# Patient Record
Sex: Male | Born: 1945 | Race: White | Hispanic: No | Marital: Married | Smoking: Never smoker
Health system: Southern US, Community
[De-identification: ages and names within clinical notes are randomized; demographics above are authoritative.]

## PROBLEM LIST (undated history)

## (undated) DIAGNOSIS — I639 Cerebral infarction, unspecified: Secondary | ICD-10-CM

## (undated) DIAGNOSIS — I10 Essential (primary) hypertension: Secondary | ICD-10-CM

## (undated) DIAGNOSIS — I4891 Unspecified atrial fibrillation: Secondary | ICD-10-CM

## (undated) DIAGNOSIS — M62838 Other muscle spasm: Secondary | ICD-10-CM

## (undated) DIAGNOSIS — I499 Cardiac arrhythmia, unspecified: Secondary | ICD-10-CM

## (undated) HISTORY — PX: SKIN CANCER EXCISION: SHX779

## (undated) HISTORY — PX: CARDIOVERSION: SHX1299

## (undated) HISTORY — PX: DENTAL SURGERY: SHX609

## (undated) HISTORY — PX: OTHER SURGICAL HISTORY: SHX169

---

## 2012-03-08 ENCOUNTER — Emergency Department (HOSPITAL_COMMUNITY): Payer: BC Managed Care – PPO

## 2012-03-08 ENCOUNTER — Encounter (HOSPITAL_COMMUNITY): Payer: Self-pay | Admitting: *Deleted

## 2012-03-08 ENCOUNTER — Emergency Department (HOSPITAL_COMMUNITY)
Admission: EM | Admit: 2012-03-08 | Discharge: 2012-03-08 | Disposition: A | Discharge status: Home / Self Care | Payer: BC Managed Care – PPO | Attending: Emergency Medicine | Admitting: Emergency Medicine

## 2012-03-08 DIAGNOSIS — R109 Unspecified abdominal pain: Secondary | ICD-10-CM | POA: Insufficient documentation

## 2012-03-08 DIAGNOSIS — K5730 Diverticulosis of large intestine without perforation or abscess without bleeding: Secondary | ICD-10-CM | POA: Insufficient documentation

## 2012-03-08 DIAGNOSIS — D72829 Elevated white blood cell count, unspecified: Secondary | ICD-10-CM | POA: Insufficient documentation

## 2012-03-08 DIAGNOSIS — M549 Dorsalgia, unspecified: Secondary | ICD-10-CM | POA: Insufficient documentation

## 2012-03-08 DIAGNOSIS — K5900 Constipation, unspecified: Secondary | ICD-10-CM | POA: Insufficient documentation

## 2012-03-08 HISTORY — DX: Other muscle spasm: M62.838

## 2012-03-08 HISTORY — DX: Essential (primary) hypertension: I10

## 2012-03-08 LAB — URINALYSIS, ROUTINE W REFLEX MICROSCOPIC
Bilirubin Urine: NEGATIVE
Hgb urine dipstick: NEGATIVE
Protein, ur: NEGATIVE mg/dL
Urobilinogen, UA: 0.2 mg/dL (ref 0.0–1.0)

## 2012-03-08 LAB — COMPREHENSIVE METABOLIC PANEL
ALT: 24 U/L (ref 0–53)
Alkaline Phosphatase: 67 U/L (ref 39–117)
CO2: 28 mEq/L (ref 19–32)
Chloride: 99 mEq/L (ref 96–112)
GFR calc Af Amer: 85 mL/min — ABNORMAL LOW (ref >90)
GFR calc non Af Amer: 74 mL/min — ABNORMAL LOW (ref >90)
Glucose, Bld: 161 mg/dL — ABNORMAL HIGH (ref 70–99)
Potassium: 4.2 mEq/L (ref 3.5–5.1)
Sodium: 137 mEq/L (ref 135–145)
Total Bilirubin: 1.3 mg/dL — ABNORMAL HIGH (ref 0.3–1.2)

## 2012-03-08 LAB — CBC
MCV: 83.6 fL (ref 78.0–100.0)
Platelets: 208 10*3/uL (ref 150–400)
RBC: 5.31 MIL/uL (ref 4.22–5.81)
WBC: 11.7 10*3/uL — ABNORMAL HIGH (ref 4.0–10.5)

## 2012-03-08 LAB — DIFFERENTIAL
Eosinophils Relative: 1 % (ref 0–5)
Lymphocytes Relative: 8 % — ABNORMAL LOW (ref 12–46)
Lymphs Abs: 0.9 10*3/uL (ref 0.7–4.0)
Neutrophils Relative %: 87 % — ABNORMAL HIGH (ref 43–77)

## 2012-03-08 MED ORDER — MORPHINE SULFATE 4 MG/ML IJ SOLN: 4.0000 mg | Freq: Once | INTRAMUSCULAR | Status: DC

## 2012-03-08 MED ORDER — METRONIDAZOLE 500 MG PO TABS: 500.0000 mg | ORAL_TABLET | Freq: Three times a day (TID) | ORAL | Status: AC

## 2012-03-08 MED ORDER — METRONIDAZOLE 500 MG PO TABS
500.0000 mg | ORAL_TABLET | Freq: Once | ORAL | Status: AC
Start: 2012-03-08 — End: 2012-03-08
  Administered 2012-03-08: 500 mg via ORAL
  Filled 2012-03-08: qty 1

## 2012-03-08 MED ORDER — CIPROFLOXACIN HCL 500 MG PO TABS
750.0000 mg | ORAL_TABLET | Freq: Once | ORAL | Status: AC
  Administered 2012-03-08: 750 mg via ORAL
  Filled 2012-03-08: qty 2

## 2012-03-08 MED ORDER — IOHEXOL 300 MG/ML  SOLN
100.0000 mL | Freq: Once | INTRAMUSCULAR | Status: AC | PRN
  Administered 2012-03-08: 100 mL via INTRAVENOUS

## 2012-03-08 MED ORDER — CIPROFLOXACIN HCL 500 MG PO TABS: 500.0000 mg | ORAL_TABLET | Freq: Two times a day (BID) | ORAL | Status: AC

## 2012-03-08 MED ORDER — ONDANSETRON HCL 4 MG/2ML IJ SOLN
4.0000 mg | Freq: Once | INTRAMUSCULAR | Status: AC
  Administered 2012-03-08: 4 mg via INTRAVENOUS
  Filled 2012-03-08: qty 2

## 2012-03-08 NOTE — ED Notes (Signed)
Care of pt assumed. Pt sts pain is better, describes as pressure in lower abd. Updated on results, plan of care. Awaiting EDP dispo.

## 2012-03-08 NOTE — ED Notes (Signed)
Received pt. From waiting room pt. Alert and oriented, NAD noted, c/o abd. Blotting , abd. Soft and distended

## 2012-03-08 NOTE — ED Notes (Signed)
Pt noted that his abdomen was beginning to swell yesterday and was becoming more distended.  Pt states that he feels bloated and as if he needs to flatulate but is unable to do so.  Pt states that he has had 2 stools today, both were loose and small in amount.  Pt states that his abdomen hurts in all four quadrants equally and that his back hurts as well.  Bowel sounds are present in all 4 quadrants, but sluggish.

## 2012-03-08 NOTE — Discharge Instructions (Signed)
Abdominal Pain Abdominal pain can be caused by many things. Your caregiver decides the seriousness of your pain by an examination and possibly blood tests and X-rays. Many cases can be observed and treated at home. Most abdominal pain is not caused by a disease and will probably improve without treatment. However, in many cases, more time must pass before a clear cause of the pain can be found. Before that point, it may not be known if you need more testing, or if hospitalization or surgery is needed. HOME CARE INSTRUCTIONS   Do not take laxatives unless directed by your caregiver.   Take pain medicine only as directed by your caregiver.   Only take over-the-counter or prescription medicines for pain, discomfort, or fever as directed by your caregiver.   Try a clear liquid diet (broth, tea, or water) for as long as directed by your caregiver. Slowly move to a bland diet as tolerated.  SEEK IMMEDIATE MEDICAL CARE IF:   The pain does not go away.   You have a fever.   You keep throwing up (vomiting).   The pain is felt only in portions of the abdomen. Pain in the right side could possibly be appendicitis. In an adult, pain in the left lower portion of the abdomen could be colitis or diverticulitis.   You pass bloody or black tarry stools.  MAKE SURE YOU:   Understand these instructions.   Will watch your condition.   Will get help right away if you are not doing well or get worse.  Document Released: 09/17/2005 Document Revised: 11/27/2011 Document Reviewed: 07/26/2008 [employer] Patient Information 2012 [employer].Abdominal Pain Abdominal pain can be caused by many things. Your caregiver decides the seriousness of your pain by an examination and possibly blood tests and X-rays. Many cases can be observed and treated at home. Most abdominal pain is not caused by a disease and will probably improve without treatment. However, in many cases, more time must pass before a clear cause of  the pain can be found. Before that point, it may not be known if you need more testing, or if hospitalization or surgery is needed. HOME CARE INSTRUCTIONS   Do not take laxatives unless directed by your caregiver.   Take pain medicine only as directed by your caregiver.   Only take over-the-counter or prescription medicines for pain, discomfort, or fever as directed by your caregiver.   Try a clear liquid diet (broth, tea, or water) for as long as directed by your caregiver. Slowly move to a bland diet as tolerated.  SEEK IMMEDIATE MEDICAL CARE IF:   The pain does not go away.   You have a fever.   You keep throwing up (vomiting).   The pain is felt only in portions of the abdomen. Pain in the right side could possibly be appendicitis. In an adult, pain in the left lower portion of the abdomen could be colitis or diverticulitis.   You pass bloody or black tarry stools.  MAKE SURE YOU:   Understand these instructions.   Will watch your condition.   Will get help right away if you are not doing well or get worse.  Document Released: 09/17/2005 Document Revised: 11/27/2011 Document Reviewed: 07/26/2008 [employer] Patient Information 2012 [employer]. 

## 2012-03-12 NOTE — ED Provider Notes (Signed)
History     CSN: [id number]  Arrival date & time 03/08/12  9528   First MD Initiated Contact with Patient 03/08/12 0335      Chief Complaint  Patient presents with  . Abdominal Pain    (Consider location/radiation/quality/duration/timing/severity/associated sxs/prior treatment) HPI Patient is a 66 yo M who presents complaining of 9/10 diffuse abdominal tenderness.  He feels that this has increased gradually since yesterday and has been accompanied with abdominal distension, nausea, and constipation.  He denies fever, urinary symptoms, chest pain, and shortness of breath.  Patient reports passing flatus and having 2 small stools after dulcolax suppositories but he is concerned that he could be obstructed.  He has no prior history of this.  PAtient denies blood in his stools or dark tarry stools.  He has no known sick contacts.  There are no other associated or modifying factors.  Past Medical History  Diagnosis Date  . Diverticulitis   . Hypertension   . Night muscle spasms     lower back and flank area    History reviewed. No pertinent past surgical history.  History reviewed. No pertinent family history.  History  Substance Use Topics  . Smoking status: Not on file  . Smokeless tobacco: Not on file  . Alcohol Use: No      Review of Systems  Constitutional: Negative.   HENT: Negative.   Eyes: Negative.   Respiratory: Negative.   Cardiovascular: Negative.   Gastrointestinal: Positive for nausea, abdominal pain and constipation.  Genitourinary: Negative.   Musculoskeletal: Negative.   Skin: Negative.   Neurological: Negative.   Hematological: Negative.   Psychiatric/Behavioral: Negative.   All other systems reviewed and are negative.    Allergies  Review of patient's allergies indicates no known allergies.  Home Medications   Current Outpatient Rx  Name Route Sig Dispense Refill  . AMLODIPINE BESYLATE 10 MG PO TABS Oral Take 10 mg by mouth daily.    .  ASPIRIN EC 81 MG PO TBEC Oral Take 81 mg by mouth daily.    Marland Kitchen BISACODYL 5 MG PO TBEC Oral Take 5 mg by mouth daily as needed. For constipation    . GENTEAL OP Ophthalmic Apply 1 drop to eye 4 (four) times daily as needed. For dry irritated eyes    . LISINOPRIL 40 MG PO TABS Oral Take 40 mg by mouth daily.    Marland Kitchen SIMETHICONE 80 MG PO CHEW Oral Chew 80 mg by mouth every 6 (six) hours as needed. For gas    . CIPROFLOXACIN HCL 500 MG PO TABS Oral Take 1 tablet (500 mg total) by mouth every 12 (twelve) hours. 20 tablet 0  . METRONIDAZOLE 500 MG PO TABS Oral Take 1 tablet (500 mg total) by mouth 3 (three) times daily. 30 tablet 0    BP 119/77  Pulse 75  Temp(Src) 98.2 F (36.8 C) (Oral)  Resp 16  Ht 6\' 3"  (1.905 m)  Wt 225 lb (102.059 kg)  BMI 28.12 kg/m2  SpO2 95%  Physical Exam  Nursing note and vitals reviewed. GEN: Well-developed, well-nourished male in no distress HEENT: Atraumatic, normocephalic. Oropharynx clear without erythema EYES: PERRLA BL, no scleral icterus. NECK: Trachea midline, no meningismus CV: regular rate and rhythm. No murmurs, rubs, or gallops PULM: No respiratory distress.  No crackles, wheezes, or rales. GI: soft, diffuse mild TTP. No guarding, rebound,. + bowel sounds Rectal with small amount of guaiac negative soft brown stool with no impaction.  No TTP on  prostate GU: deferred Neuro: cranial nerves 2-12 intact, no abnormalities of strength or sensation, A and O x 3 MSK: Patient moves all 4 extremities symmetrically, no deformity, edema, or injury noted Skin: No rashes petechiae, purpura, or jaundice Psych: no abnormality of mood   ED Course  Procedures (including critical care time)  Labs Reviewed  CBC - Abnormal; Notable for the following:    WBC 11.7 (*)    All other components within normal limits  DIFFERENTIAL - Abnormal; Notable for the following:    Neutrophils Relative 87 (*)    Neutro Abs 10.1 (*)    Lymphocytes Relative 8 (*)    All other  components within normal limits  COMPREHENSIVE METABOLIC PANEL - Abnormal; Notable for the following:    Glucose, Bld 161 (*)    Total Bilirubin 1.3 (*)    GFR calc non Af Amer 74 (*)    GFR calc Af Amer 85 (*)    All other components within normal limits  LIPASE, BLOOD  URINALYSIS, ROUTINE W REFLEX MICROSCOPIC  LACTIC ACID, PLASMA  OCCULT BLOOD, POC DEVICE  LAB REPORT - SCANNED   No results found.   1. Abdominal pain   2. Constipation       MDM  Patient was treated symptomatically and had complete work-up for abdominal pain including CT.  This was all relatively unremarkable.  Patient did have symptoms that appeared to be more pronounced in the LLQ as visit progressed.  Given patient presentation and this being so atypical for him empiric therapy for diverticulitis was felt to be a reasonable option.  Patient was given cipro and flagyl with prescription for each.  Patient was discharged in good condition with precautions for reasons to return.        Cyndra Numbers, MD 03/12/12 [phone]

## 2015-12-31 ENCOUNTER — Encounter (HOSPITAL_COMMUNITY): Payer: Self-pay | Admitting: Oncology

## 2015-12-31 ENCOUNTER — Emergency Department (HOSPITAL_COMMUNITY): Payer: Medicare HMO

## 2015-12-31 ENCOUNTER — Emergency Department (HOSPITAL_COMMUNITY)
Admission: EM | Admit: 2015-12-31 | Discharge: 2015-12-31 | Disposition: A | Discharge status: Home / Self Care | Payer: Medicare HMO | Attending: Emergency Medicine | Admitting: Emergency Medicine

## 2015-12-31 DIAGNOSIS — Z79899 Other long term (current) drug therapy: Secondary | ICD-10-CM | POA: Diagnosis not present

## 2015-12-31 DIAGNOSIS — Y9289 Other specified places as the place of occurrence of the external cause: Secondary | ICD-10-CM | POA: Diagnosis not present

## 2015-12-31 DIAGNOSIS — I10 Essential (primary) hypertension: Secondary | ICD-10-CM | POA: Diagnosis not present

## 2015-12-31 DIAGNOSIS — Y9389 Activity, other specified: Secondary | ICD-10-CM | POA: Diagnosis not present

## 2015-12-31 DIAGNOSIS — Y998 Other external cause status: Secondary | ICD-10-CM | POA: Insufficient documentation

## 2015-12-31 DIAGNOSIS — W000XXA Fall on same level due to ice and snow, initial encounter: Secondary | ICD-10-CM | POA: Insufficient documentation

## 2015-12-31 DIAGNOSIS — Z8719 Personal history of other diseases of the digestive system: Secondary | ICD-10-CM | POA: Diagnosis not present

## 2015-12-31 DIAGNOSIS — M62830 Muscle spasm of back: Secondary | ICD-10-CM

## 2015-12-31 DIAGNOSIS — S0990XA Unspecified injury of head, initial encounter: Secondary | ICD-10-CM | POA: Diagnosis present

## 2015-12-31 DIAGNOSIS — S0003XA Contusion of scalp, initial encounter: Secondary | ICD-10-CM | POA: Insufficient documentation

## 2015-12-31 DIAGNOSIS — S199XXA Unspecified injury of neck, initial encounter: Secondary | ICD-10-CM | POA: Insufficient documentation

## 2015-12-31 DIAGNOSIS — Z7982 Long term (current) use of aspirin: Secondary | ICD-10-CM | POA: Diagnosis not present

## 2015-12-31 DIAGNOSIS — S3992XA Unspecified injury of lower back, initial encounter: Secondary | ICD-10-CM | POA: Insufficient documentation

## 2015-12-31 DIAGNOSIS — W010XXA Fall on same level from slipping, tripping and stumbling without subsequent striking against object, initial encounter: Secondary | ICD-10-CM

## 2015-12-31 MED ORDER — ORPHENADRINE CITRATE ER 100 MG PO TB12: 100.0000 mg | ORAL_TABLET | Freq: Two times a day (BID) | ORAL | Status: DC

## 2015-12-31 MED ORDER — CYCLOBENZAPRINE HCL 10 MG PO TABS
10.0000 mg | ORAL_TABLET | Freq: Once | ORAL | Status: AC
  Administered 2015-12-31: 10 mg via ORAL
  Filled 2015-12-31: qty 1

## 2015-12-31 MED ORDER — OXYCODONE-ACETAMINOPHEN 5-325 MG PO TABS
1.0000 | ORAL_TABLET | Freq: Once | ORAL | Status: AC
  Administered 2015-12-31: 1 via ORAL
  Filled 2015-12-31: qty 1

## 2015-12-31 MED ORDER — OXYCODONE-ACETAMINOPHEN 5-325 MG PO TABS: 1.0000 | ORAL_TABLET | ORAL | Status: DC | PRN

## 2015-12-31 NOTE — ED Notes (Addendum)
Per pt he slipped and fell on the ice today injuring his back and hitting his head.  Denies LOC.  Per pt he cannot find a comfortable position.  Pt c/o back spasms.  No anticoagulants.  Pt also c/o neck pain that radiates into b/l shoulders.

## 2015-12-31 NOTE — ED Provider Notes (Signed)
CSN: [id number]     Arrival date & time 12/31/15  0008 History  By signing my name below, I, Bethel BornBritney McCollum, attest that this documentation has been prepared under the direction and in the presence of Dione Boozeavid Harvey Matlack, MD. Electronically Signed: Bethel BornBritney McCollum, ED Scribe. 12/31/2015. 2:39 AM    Chief Complaint  Patient presents with  . Back Pain    The history is provided by the patient. No language interpreter was used.   Tyler Ruiz is a 70 y.o. male who presents to the Emergency Department complaining of a fall yesterday near 8 PM. The pt states that he slipped on ice and fell backwards. He hit his head but denies LOC.  Associated symptoms include 9/10 in severity, diffuse sore and spasming back pain that is worse with laying flat and neck pain that radiates to the bilateral shoulders. Advil provided insufficient pain relief at home.  Pt denies numbness, tingling, weakness. Pt denies blood thinners apart from aspirin.  PCP [place]. [place] in [place].    Past Medical History  Diagnosis Date  . Diverticulitis   . Hypertension   . Night muscle spasms     lower back and flank area   History reviewed. No pertinent past surgical history. History reviewed. No pertinent family history. Social History  Substance Use Topics  . Smoking status: Never Smoker   . Smokeless tobacco: Never Used  . Alcohol Use: No    Review of Systems  Musculoskeletal: Positive for back pain and neck pain.  Neurological: Negative for syncope, weakness and numbness.  All other systems reviewed and are negative.  Allergies  Review of patient's allergies indicates no known allergies.  Home Medications   Prior to Admission medications   Medication Sig Start Date End Date Taking? Authorizing Provider  amLODipine (NORVASC) 10 MG tablet Take 5 mg by mouth daily.    Yes Historical Provider, MD  aspirin EC 81 MG tablet Take 81 mg by mouth daily.   Yes Historical Provider, MD  cholecalciferol (VITAMIN D) 1000  units tablet Take 1,000 Units by mouth daily.   Yes Historical Provider, MD  docusate sodium (COLACE) 100 MG capsule Take 100-200 mg by mouth daily as needed for mild constipation.   Yes Historical Provider, MD  flecainide (TAMBOCOR) 100 MG tablet Take 100 mg by mouth 2 (two) times daily. 12/04/15  Yes Historical Provider, MD  lisinopril (PRINIVIL,ZESTRIL) 40 MG tablet Take 40 mg by mouth daily.   Yes Historical Provider, MD  Multiple Vitamin (MULTIVITAMIN WITH MINERALS) TABS tablet Take 1 tablet by mouth daily.   Yes Historical Provider, MD  Omega-3 Fatty Acids (FISH OIL) 1000 MG CAPS Take 1,000 mg by mouth daily.   Yes Historical Provider, MD   BP 154/87 mmHg  Temp(Src) 98.1 F (36.7 C) (Oral)  Resp 20  SpO2 97% Physical Exam  Constitutional: He is oriented to person, place, and time. He appears well-developed and well-nourished.  HENT:  Head: Normocephalic and atraumatic.  Eyes: EOM are normal. Pupils are equal, round, and reactive to light.  Neck: No JVD present.  Mild tenderness over the cervical spine  Cardiovascular: Normal rate, regular rhythm, normal heart sounds and intact distal pulses.   No murmur heard. Pulmonary/Chest: Effort normal and breath sounds normal. He has no wheezes. He has no rales. He exhibits no tenderness.  Abdominal: Soft. Bowel sounds are normal. He exhibits no distension and no mass. There is no tenderness.  Musculoskeletal: Normal range of motion. He exhibits no edema.  Lymphadenopathy:    He has no cervical adenopathy.  Neurological: He is alert and oriented to person, place, and time. No cranial nerve deficit. He exhibits normal muscle tone. Coordination normal.  Skin: Skin is warm and dry. No rash noted.  Psychiatric: He has a normal mood and affect. His behavior is normal. Judgment and thought content normal.  Nursing note and vitals reviewed.   ED Course  Procedures (including critical care time) DIAGNOSTIC STUDIES: Oxygen Saturation is 97% on  RA,  normal by my interpretation.    COORDINATION OF CARE: 12:35 AM Discussed treatment plan which includes Percocet, Flexeril, CT head without contrast, CT cervical spine without contrast, XR of the thoracic spine, and XR of the lumbar spine with pt at bedside and pt agreed to plan.  2:37 AM I re-evaluated the patient and provided an update on the results of his imaging.    Imaging Review Dg Thoracic Spine W/swimmers  12/31/2015  CLINICAL DATA:  Thoracic back pain after slip and fall on ice this evening. EXAM: THORACIC SPINE - 3 VIEWS COMPARISON:  None. FINDINGS: The alignment is maintained. Vertebral body heights are maintained. Disc space narrowing and endplate spurring in the lower thoracic spine. There is no paravertebral soft tissue abnormality. IMPRESSION: No evidence of fracture or subluxation of the thoracic spine. Electronically Signed   By: Rubye Oaks M.D.   On: 12/31/2015 01:57   Dg Lumbar Spine Complete  12/31/2015  CLINICAL DATA:  Lumbosacral back pain after slip and fall on ice this evening. EXAM: LUMBAR SPINE - COMPLETE 4+ VIEW COMPARISON:  None. FINDINGS: There is transitional lumbosacral anatomy with lumbarization of S1. The alignment is maintained. Vertebral body heights are normal. There is no listhesis. The posterior elements are intact. Mild disc space narrowing and endplate spurring at L2-L3. No fracture. Facet arthropathy in the lower lumbar spine. Sacroiliac joints are symmetric and normal. IMPRESSION: Degenerative change in the lumbar spine without acute fracture or subluxation. Electronically Signed   By: Rubye Oaks M.D.   On: 12/31/2015 01:55   Ct Head Wo Contrast  12/31/2015  CLINICAL DATA:  Slip and fall on ice last evening, no loss of consciousness. Neck pain radiating to both shoulders. EXAM: CT HEAD WITHOUT CONTRAST CT CERVICAL SPINE WITHOUT CONTRAST TECHNIQUE: Multidetector CT imaging of the head and cervical spine was performed following the standard  protocol without intravenous contrast. Multiplanar CT image reconstructions of the cervical spine were also generated. COMPARISON:  None. FINDINGS: CT HEAD FINDINGS No intracranial hemorrhage, mass effect, or midline shift. No hydrocephalus. The basilar cisterns are patent. No evidence of territorial infarct. No intracranial fluid collection. Dense atherosclerosis of skullbase vasculature. Ectasia of the basilar artery. Calvarium is intact. Included paranasal sinuses and mastoid air cells are well aerated. CT CERVICAL SPINE FINDINGS Cervical spine alignment is maintained. Vertebral body heights are preserved. There is no fracture. The dens is intact. There are no jumped or perched facets. Mild disc space narrowing and endplate spurring throughout. Scattered facet arthropathy. No prevertebral soft tissue edema. Diffuse heterogeneous enlargement of the thyroid gland consistent with goiter. IMPRESSION: 1. No acute intracranial abnormality. 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 2. Degenerative change in the cervical spine without acute fracture or subluxation. Electronically Signed   By: Rubye Oaks M.D.   On: 12/31/2015 02:02   Ct Cervical Spine Wo Contrast  12/31/2015  CLINICAL DATA:  Slip and fall on ice last evening, no loss of consciousness. Neck pain radiating to both shoulders. EXAM:  CT HEAD WITHOUT CONTRAST CT CERVICAL SPINE WITHOUT CONTRAST TECHNIQUE: Multidetector CT imaging of the head and cervical spine was performed following the standard protocol without intravenous contrast. Multiplanar CT image reconstructions of the cervical spine were also generated. COMPARISON:  None. FINDINGS: CT HEAD FINDINGS No intracranial hemorrhage, mass effect, or midline shift. No hydrocephalus. The basilar cisterns are patent. No evidence of territorial infarct. No intracranial fluid collection. Dense atherosclerosis of skullbase vasculature. Ectasia of the basilar artery. Calvarium is intact.  Included paranasal sinuses and mastoid air cells are well aerated. CT CERVICAL SPINE FINDINGS Cervical spine alignment is maintained. Vertebral body heights are preserved. There is no fracture. The dens is intact. There are no jumped or perched facets. Mild disc space narrowing and endplate spurring throughout. Scattered facet arthropathy. No prevertebral soft tissue edema. Diffuse heterogeneous enlargement of the thyroid gland consistent with goiter. IMPRESSION: 1. No acute intracranial abnormality. 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 2. Degenerative change in the cervical spine without acute fracture or subluxation. Electronically Signed   By: Rubye Oaks M.D.   On: 12/31/2015 02:02   I have personally reviewed and evaluated these images as part of my medical decision-making.  MDM   Final diagnoses:  Fall from slipping, initial encounter  Contusion of scalp, initial encounter  Spasm of back muscles    Fall with head injury and intermittent muscle spasms of his back. Of note, he did several studies yesterday so some of the muscle spasms may be related to overuse. He is sent for CT of head and cervical spine as well as plain x-rays of thoracic and lumbar spine. Old records are reviewed and he has no relevant past visits.  CT and x-ray show no acute injury. He was given cyclobenzaprine and oxycodone have acetaminophen in the ED with significant relief. He is discharged with prescriptions for orphenadrine and oxycodone have acetaminophen. Told to take over-the-counter NSAIDs as needed.  I personally performed the services described in this documentation, which was scribed in my presence. The recorded information has been reviewed and is accurate.      Dione Booze, MD 12/31/15 [phone]

## 2015-12-31 NOTE — Discharge Instructions (Signed)
Take ibuprofen or naproxen as needed.  Fall Prevention in the Home  Falls can cause injuries and can affect people from all age groups. There are many simple things that you can do to make your home safe and to help prevent falls. WHAT CAN I DO ON THE OUTSIDE OF MY HOME?  Regularly repair the edges of walkways and driveways and fix any cracks.  Remove high doorway thresholds.  Trim any shrubbery on the main path into your home.  Use bright outdoor lighting.  Clear walkways of debris and clutter, including tools and rocks.  Regularly check that handrails are securely fastened and in good repair. Both sides of any steps should have handrails.  Install guardrails along the edges of any raised decks or porches.  Have leaves, snow, and ice cleared regularly.  Use sand or salt on walkways during winter months.  In the garage, clean up any spills right away, including grease or oil spills. WHAT CAN I DO IN THE BATHROOM?  Use night lights.  Install grab bars by the toilet and in the tub and shower. Do not use towel bars as grab bars.  Use non-skid mats or decals on the floor of the tub or shower.  If you need to sit down while you are in the shower, use a plastic, non-slip stool.Marland Kitchen.  Keep the floor dry. Immediately clean up any water that spills on the floor.  Remove soap buildup in the tub or shower on a regular basis.  Attach bath mats securely with double-sided non-slip rug tape.  Remove throw rugs and other tripping hazards from the floor. WHAT CAN I DO IN THE BEDROOM?  Use night lights.  Make sure that a bedside light is easy to reach.  Do not use oversized bedding that drapes onto the floor.  Have a firm chair that has side arms to use for getting dressed.  Remove throw rugs and other tripping hazards from the floor. WHAT CAN I DO IN THE KITCHEN?   Clean up any spills right away.  Avoid walking on wet floors.  Place frequently used items in easy-to-reach  places.  If you need to reach for something above you, use a sturdy step stool that has a grab bar.  Keep electrical cables out of the way.  Do not use floor polish or wax that makes floors slippery. If you have to use wax, make sure that it is non-skid floor wax.  Remove throw rugs and other tripping hazards from the floor. WHAT CAN I DO IN THE STAIRWAYS?  Do not leave any items on the stairs.  Make sure that there are handrails on both sides of the stairs. Fix handrails that are broken or loose. Make sure that handrails are as long as the stairways.  Check any carpeting to make sure that it is firmly attached to the stairs. Fix any carpet that is loose or worn.  Avoid having throw rugs at the top or bottom of stairways, or secure the rugs with carpet tape to prevent them from moving.  Make sure that you have a light switch at the top of the stairs and the bottom of the stairs. If you do not have them, have them installed. WHAT ARE SOME OTHER FALL PREVENTION TIPS?  Wear closed-toe shoes that fit well and support your feet. Wear shoes that have rubber soles or low heels.  When you use a stepladder, make sure that it is completely opened and that the sides are firmly locked.  Have someone hold the ladder while you are using it. Do not climb a closed stepladder.  Add color or contrast paint or tape to grab bars and handrails in your home. Place contrasting color strips on the first and last steps.  Use mobility aids as needed, such as canes, walkers, scooters, and crutches.  Turn on lights if it is dark. Replace any light bulbs that burn out.  Set up furniture so that there are clear paths. Keep the furniture in the same spot.  Fix any uneven floor surfaces.  Choose a carpet design that does not hide the edge of steps of a stairway.  Be aware of any and all pets.  Review your medicines with your healthcare provider. Some medicines can cause dizziness or changes in blood pressure,  which increase your risk of falling. Talk with your health care provider about other ways that you can decrease your risk of falls. This may include working with a physical therapist or trainer to improve your strength, balance, and endurance.   This information is not intended to replace advice given to you by your health care provider. Make sure you discuss any questions you have with your health care provider.   Document Released: 11/28/2002 Document Revised: 04/24/2015 Document Reviewed: 01/12/2015 Elsevier Interactive Patient Education 2016 [employer]   Contusion A contusion is a deep bruise. Contusions are the result of a blunt injury to tissues and muscle fibers under the skin. The injury causes bleeding under the skin. The skin overlying the contusion may turn blue, purple, or yellow. Minor injuries will give you a painless contusion, but more severe contusions may stay painful and swollen for a few weeks.  CAUSES  This condition is usually caused by a blow, trauma, or direct force to an area of the body. SYMPTOMS  Symptoms of this condition include:  Swelling of the injured area.  Pain and tenderness in the injured area.  Discoloration. The area may have redness and then turn blue, purple, or yellow. DIAGNOSIS  This condition is diagnosed based on a physical exam and medical history. An X-ray, CT scan, or MRI may be needed to determine if there are any associated injuries, such as broken bones (fractures). TREATMENT  Specific treatment for this condition depends on what area of the body was injured. In general, the best treatment for a contusion is resting, icing, applying pressure to (compression), and elevating the injured area. This is often called the RICE strategy. Over-the-counter anti-inflammatory medicines may also be recommended for pain control.  HOME CARE INSTRUCTIONS   Rest the injured area.  If directed, apply ice to the injured area:  Put ice in a plastic  bag.  Place a towel between your skin and the bag.  Leave the ice on for 20 minutes, 2-3 times per day.  If directed, apply light compression to the injured area using an elastic bandage. Make sure the bandage is not wrapped too tightly. Remove and reapply the bandage as directed by your health care provider.  If possible, raise (elevate) the injured area above the level of your heart while you are sitting or lying down.  Take over-the-counter and prescription medicines only as told by your health care provider. SEEK MEDICAL CARE IF:  Your symptoms do not improve after several days of treatment.  Your symptoms get worse.  You have difficulty moving the injured area. SEEK IMMEDIATE MEDICAL CARE IF:   You have severe pain.  You have numbness in a hand or  foot.  Your hand or foot turns pale or cold.   This information is not intended to replace advice given to you by your health care provider. Make sure you discuss any questions you have with your health care provider.   Document Released: 09/17/2005 Document Revised: 08/29/2015 Document Reviewed: 04/25/2015 Elsevier Interactive Patient Education 2016 [employer]   Muscle Cramps and Spasms Muscle cramps and spasms occur when a muscle or muscles tighten and you have no control over this tightening (involuntary muscle contraction). They are a common problem and can develop in any muscle. The most common place is in the calf muscles of the leg. Both muscle cramps and muscle spasms are involuntary muscle contractions, but they also have differences:   Muscle cramps are sporadic and painful. They may last a few seconds to a quarter of an hour. Muscle cramps are often more forceful and last longer than muscle spasms.  Muscle spasms may or may not be painful. They may also last just a few seconds or much longer. CAUSES  It is uncommon for cramps or spasms to be due to a serious underlying problem. In many cases, the cause of cramps or  spasms is unknown. Some common causes are:   Overexertion.   Overuse from repetitive motions (doing the same thing over and over).   Remaining in a certain position for a long period of time.   Improper preparation, form, or technique while performing a sport or activity.   Dehydration.   Injury.   Side effects of some medicines.   Abnormally low levels of the salts and ions in your blood (electrolytes), especially potassium and calcium. This could happen if you are taking water pills (diuretics) or you are pregnant.  Some underlying medical problems can make it more likely to develop cramps or spasms. These include, but are not limited to:   Diabetes.   Parkinson disease.   Hormone disorders, such as thyroid problems.   Alcohol abuse.   Diseases specific to muscles, joints, and bones.   Blood vessel disease where not enough blood is getting to the muscles.  HOME CARE INSTRUCTIONS   Stay well hydrated. Drink enough water and fluids to keep your urine clear or pale yellow.  It may be helpful to massage, stretch, and relax the affected muscle.  For tight or tense muscles, use a warm towel, heating pad, or hot shower water directed to the affected area.  If you are sore or have pain after a cramp or spasm, applying ice to the affected area may relieve discomfort.  Put ice in a plastic bag.  Place a towel between your skin and the bag.  Leave the ice on for 15-20 minutes, 03-04 times a day.  Medicines used to treat a known cause of cramps or spasms may help reduce their frequency or severity. Only take over-the-counter or prescription medicines as directed by your caregiver. SEEK MEDICAL CARE IF:  Your cramps or spasms get more severe, more frequent, or do not improve over time.  MAKE SURE YOU:   Understand these instructions.  Will watch your condition.  Will get help right away if you are not doing well or get worse.   This information is not  intended to replace advice given to you by your health care provider. Make sure you discuss any questions you have with your health care provider.   Document Released: 05/30/2002 Document Revised: 04/04/2013 Document Reviewed: 11/24/2012 Elsevier Interactive Patient Education 2016 [employer] Inc.  Orphenadrine tablets  What is this medicine? ORPHENADRINE (or FEN a dreen) helps to relieve pain and stiffness in muscles and can treat muscle spasms. This medicine may be used for other purposes; ask your health care provider or pharmacist if you have questions. What should I tell my health care provider before I take this medicine? They need to know if you have any of these conditions: -glaucoma -heart disease -kidney disease -myasthenia gravis -peptic ulcer disease -prostate disease -stomach problems -an unusual or allergic reaction to orphenadrine, other medicines, foods, lactose, dyes, or preservatives -pregnant or trying to get pregnant -breast-feeding How should I use this medicine? Take this medicine by mouth with a full glass of water. Follow the directions on the prescription label. Take your medicine at regular intervals. Do not take your medicine more often than directed. Do not take more than you are told to take. Talk to your pediatrician regarding the use of this medicine in children. Special care may be needed. Patients over 47 years old may have a stronger reaction and need a smaller dose. Overdosage: If you think you have taken too much of this medicine contact a poison control center or emergency room at once. NOTE: This medicine is only for you. Do not share this medicine with others. What if I miss a dose? If you miss a dose, take it as soon as you can. If it is almost time for your next dose, take only that dose. Do not take double or extra doses. What may interact with this medicine? -alcohol -antihistamines -barbiturates, like  phenobarbital -benzodiazepines -cyclobenzaprine -medicines for pain -phenothiazines like chlorpromazine, mesoridazine, prochlorperazine, thioridazine This list may not describe all possible interactions. Give your health care provider a list of all the medicines, herbs, non-prescription drugs, or dietary supplements you use. Also tell them if you smoke, drink alcohol, or use illegal drugs. Some items may interact with your medicine. What should I watch for while using this medicine? Your mouth may get dry. Chewing sugarless gum or sucking hard candy, and drinking plenty of water may help. Contact your doctor if the problem does not go away or is severe. This medicine may cause dry eyes and blurred vision. If you wear contact lenses you may feel some discomfort. Lubricating drops may help. See your eye doctor if the problem does not go away or is severe. You may get drowsy or dizzy. Do not drive, use machinery, or do anything that needs mental alertness until you know how this medicine affects you. Do not stand or sit up quickly, especially if you are an older patient. This reduces the risk of dizzy or fainting spells. Alcohol may interfere with the effect of this medicine. Avoid alcoholic drinks. What side effects may I notice from receiving this medicine? Side effects that you should report to your doctor or health care professional as soon as possible: -allergic reactions like skin rash, itching or hives, swelling of the face, lips, or tongue -changes in vision -difficulty breathing -fast heartbeat or palpitations -hallucinations -light headedness, fainting spells -vomiting Side effects that usually do not require medical attention (report to your doctor or health care professional if they continue or are bothersome): -dizziness -drowsiness -headache -nausea This list may not describe all possible side effects. Call your doctor for medical advice about side effects. You may report side  effects to FDA at 1-800-FDA-1088. Where should I keep my medicine? Keep out of the reach of children. This medicine may cause accidental overdose and death if it taken by  other adults, children, or pets. Mix any unused medicine with a substance like cat litter or coffee grounds. Then throw the medicine away in a sealed container like a sealed bag or a coffee can with a lid. Do not use the medicine after the expiration date. Store at room temperature between 15 and 30 degrees C (59 and 86 degrees F). NOTE: This sheet is a summary. It may not cover all possible information. If you have questions about this medicine, talk to your doctor, pharmacist, or health care provider.    2016, Elsevier/Gold Standard. (2014-02-03 15:35:08)  Acetaminophen; Oxycodone tablets What is this medicine? ACETAMINOPHEN; OXYCODONE (a set a MEE noe fen; ox i KOE done) is a pain reliever. It is used to treat moderate to severe pain. This medicine may be used for other purposes; ask your health care provider or pharmacist if you have questions. What should I tell my health care provider before I take this medicine? They need to know if you have any of these conditions: -brain tumor -Crohn's disease, inflammatory bowel disease, or ulcerative colitis -drug abuse or addiction -head injury -heart or circulation problems -if you often drink alcohol -kidney disease or problems going to the bathroom -liver disease -lung disease, asthma, or breathing problems -an unusual or allergic reaction to acetaminophen, oxycodone, other opioid analgesics, other medicines, foods, dyes, or preservatives -pregnant or trying to get pregnant -breast-feeding How should I use this medicine? Take this medicine by mouth with a full glass of water. Follow the directions on the prescription label. You can take it with or without food. If it upsets your stomach, take it with food. Take your medicine at regular intervals. Do not take it more often  than directed. Talk to your pediatrician regarding the use of this medicine in children. Special care may be needed. Patients over 71 years old may have a stronger reaction and need a smaller dose. Overdosage: If you think you have taken too much of this medicine contact a poison control center or emergency room at once. NOTE: This medicine is only for you. Do not share this medicine with others. What if I miss a dose? If you miss a dose, take it as soon as you can. If it is almost time for your next dose, take only that dose. Do not take double or extra doses. What may interact with this medicine? -alcohol -antihistamines -barbiturates like amobarbital, butalbital, butabarbital, methohexital, pentobarbital, phenobarbital, thiopental, and secobarbital -benztropine -drugs for bladder problems like solifenacin, trospium, oxybutynin, tolterodine, hyoscyamine, and methscopolamine -drugs for breathing problems like ipratropium and tiotropium -drugs for certain stomach or intestine problems like propantheline, homatropine methylbromide, glycopyrrolate, atropine, belladonna, and dicyclomine -general anesthetics like etomidate, ketamine, nitrous oxide, propofol, desflurane, enflurane, halothane, isoflurane, and sevoflurane -medicines for depression, anxiety, or psychotic disturbances -medicines for sleep -muscle relaxants -naltrexone -narcotic medicines (opiates) for pain -phenothiazines like perphenazine, thioridazine, chlorpromazine, mesoridazine, fluphenazine, prochlorperazine, promazine, and trifluoperazine -scopolamine -tramadol -trihexyphenidyl This list may not describe all possible interactions. Give your health care provider a list of all the medicines, herbs, non-prescription drugs, or dietary supplements you use. Also tell them if you smoke, drink alcohol, or use illegal drugs. Some items may interact with your medicine. What should I watch for while using this medicine? Tell your  doctor or health care professional if your pain does not go away, if it gets worse, or if you have new or a different type of pain. You may develop tolerance to the medicine. Tolerance means that you  will need a higher dose of the medication for pain relief. Tolerance is normal and is expected if you take this medicine for a long time. Do not suddenly stop taking your medicine because you may develop a severe reaction. Your body becomes used to the medicine. This does NOT mean you are addicted. Addiction is a behavior related to getting and using a drug for a non-medical reason. If you have pain, you have a medical reason to take pain medicine. Your doctor will tell you how much medicine to take. If your doctor wants you to stop the medicine, the dose will be slowly lowered over time to avoid any side effects. You may get drowsy or dizzy. Do not drive, use machinery, or do anything that needs mental alertness until you know how this medicine affects you. Do not stand or sit up quickly, especially if you are an older patient. This reduces the risk of dizzy or fainting spells. Alcohol may interfere with the effect of this medicine. Avoid alcoholic drinks. There are different types of narcotic medicines (opiates) for pain. If you take more than one type at the same time, you may have more side effects. Give your health care provider a list of all medicines you use. Your doctor will tell you how much medicine to take. Do not take more medicine than directed. Call emergency for help if you have problems breathing. The medicine will cause constipation. Try to have a bowel movement at least every 2 to 3 days. If you do not have a bowel movement for 3 days, call your doctor or health care professional. Do not take Tylenol (acetaminophen) or medicines that have acetaminophen with this medicine. Too much acetaminophen can be very dangerous. Many nonprescription medicines contain acetaminophen. Always read the labels  carefully to avoid taking more acetaminophen. What side effects may I notice from receiving this medicine? Side effects that you should report to your doctor or health care professional as soon as possible: -allergic reactions like skin rash, itching or hives, swelling of the face, lips, or tongue -breathing difficulties, wheezing -confusion -light headedness or fainting spells -severe stomach pain -unusually weak or tired -yellowing of the skin or the whites of the eyes Side effects that usually do not require medical attention (report to your doctor or health care professional if they continue or are bothersome): -dizziness -drowsiness -nausea -vomiting This list may not describe all possible side effects. Call your doctor for medical advice about side effects. You may report side effects to FDA at 1-800-FDA-1088. Where should I keep my medicine? Keep out of the reach of children. This medicine can be abused. Keep your medicine in a safe place to protect it from theft. Do not share this medicine with anyone. Selling or giving away this medicine is dangerous and against the law. This medicine may cause accidental overdose and death if it taken by other adults, children, or pets. Mix any unused medicine with a substance like cat litter or coffee grounds. Then throw the medicine away in a sealed container like a sealed bag or a coffee can with a lid. Do not use the medicine after the expiration date. Store at room temperature between 20 and 25 degrees C (68 and 77 degrees F). NOTE: This sheet is a summary. It may not cover all possible information. If you have questions about this medicine, talk to your doctor, pharmacist, or health care provider.    2016, Elsevier/[employer] Standard. (2014-11-08 15:18:46)

## 2016-09-09 ENCOUNTER — Emergency Department (HOSPITAL_COMMUNITY): Payer: Medicare HMO

## 2016-09-09 ENCOUNTER — Inpatient Hospital Stay (HOSPITAL_COMMUNITY)
Admission: EM | Admit: 2016-09-09 | Discharge: 2016-09-10 | DRG: 066 | Disposition: A | Discharge status: Home / Self Care | Payer: Medicare HMO | Attending: Internal Medicine | Admitting: Internal Medicine

## 2016-09-09 ENCOUNTER — Encounter (HOSPITAL_COMMUNITY): Payer: Self-pay | Admitting: Emergency Medicine

## 2016-09-09 DIAGNOSIS — Z7982 Long term (current) use of aspirin: Secondary | ICD-10-CM | POA: Diagnosis not present

## 2016-09-09 DIAGNOSIS — R278 Other lack of coordination: Secondary | ICD-10-CM

## 2016-09-09 DIAGNOSIS — M6281 Muscle weakness (generalized): Secondary | ICD-10-CM

## 2016-09-09 DIAGNOSIS — I63412 Cerebral infarction due to embolism of left middle cerebral artery: Principal | ICD-10-CM | POA: Diagnosis present

## 2016-09-09 DIAGNOSIS — E785 Hyperlipidemia, unspecified: Secondary | ICD-10-CM | POA: Diagnosis present

## 2016-09-09 DIAGNOSIS — R4781 Slurred speech: Secondary | ICD-10-CM | POA: Diagnosis present

## 2016-09-09 DIAGNOSIS — Z79899 Other long term (current) drug therapy: Secondary | ICD-10-CM

## 2016-09-09 DIAGNOSIS — I10 Essential (primary) hypertension: Secondary | ICD-10-CM

## 2016-09-09 DIAGNOSIS — I639 Cerebral infarction, unspecified: Secondary | ICD-10-CM | POA: Diagnosis not present

## 2016-09-09 DIAGNOSIS — Z8673 Personal history of transient ischemic attack (TIA), and cerebral infarction without residual deficits: Secondary | ICD-10-CM

## 2016-09-09 DIAGNOSIS — E663 Overweight: Secondary | ICD-10-CM | POA: Diagnosis present

## 2016-09-09 DIAGNOSIS — Z823 Family history of stroke: Secondary | ICD-10-CM

## 2016-09-09 DIAGNOSIS — Z6828 Body mass index (BMI) 28.0-28.9, adult: Secondary | ICD-10-CM

## 2016-09-09 DIAGNOSIS — M5090 Cervical disc disorder, unspecified, unspecified cervical region: Secondary | ICD-10-CM

## 2016-09-09 DIAGNOSIS — R202 Paresthesia of skin: Secondary | ICD-10-CM | POA: Diagnosis present

## 2016-09-09 DIAGNOSIS — I6789 Other cerebrovascular disease: Secondary | ICD-10-CM | POA: Diagnosis not present

## 2016-09-09 DIAGNOSIS — Z85828 Personal history of other malignant neoplasm of skin: Secondary | ICD-10-CM

## 2016-09-09 DIAGNOSIS — I4891 Unspecified atrial fibrillation: Secondary | ICD-10-CM

## 2016-09-09 DIAGNOSIS — I480 Paroxysmal atrial fibrillation: Secondary | ICD-10-CM | POA: Diagnosis not present

## 2016-09-09 HISTORY — DX: Cardiac arrhythmia, unspecified: I49.9

## 2016-09-09 HISTORY — DX: Unspecified atrial fibrillation: I48.91

## 2016-09-09 HISTORY — DX: Cerebral infarction, unspecified: I63.9

## 2016-09-09 LAB — I-STAT TROPONIN, ED: TROPONIN I, POC: 0 ng/mL (ref 0.00–0.08)

## 2016-09-09 LAB — CBC
HCT: 43.6 % (ref 39.0–52.0)
HEMOGLOBIN: 15 g/dL (ref 13.0–17.0)
MCH: 29.6 pg (ref 26.0–34.0)
MCHC: 34.4 g/dL (ref 30.0–36.0)
MCV: 86 fL (ref 78.0–100.0)
PLATELETS: 190 10*3/uL (ref 150–400)
RBC: 5.07 MIL/uL (ref 4.22–5.81)
RDW: 13.1 % (ref 11.5–15.5)
WBC: 7.3 10*3/uL (ref 4.0–10.5)

## 2016-09-09 LAB — BASIC METABOLIC PANEL
ANION GAP: 6 (ref 5–15)
BUN: 16 mg/dL (ref 6–20)
CALCIUM: 9.4 mg/dL (ref 8.9–10.3)
CO2: 29 mmol/L (ref 22–32)
CREATININE: 1.22 mg/dL (ref 0.61–1.24)
Chloride: 105 mmol/L (ref 101–111)
GFR, EST NON AFRICAN AMERICAN: 59 mL/min — AB (ref >60)
GLUCOSE: 91 mg/dL (ref 65–99)
Potassium: 4.7 mmol/L (ref 3.5–5.1)
Sodium: 140 mmol/L (ref 135–145)

## 2016-09-09 MED ORDER — PNEUMOCOCCAL VAC POLYVALENT 25 MCG/0.5ML IJ INJ
0.5000 mL | INJECTION | INTRAMUSCULAR | Status: DC
  Filled 2016-09-09: qty 0.5

## 2016-09-09 NOTE — ED Provider Notes (Signed)
Discussed MRI results with patient and his family. Also discussed with neurologist Dr. Roseanne RenoStewart. Will admit to [facility] via hospitalist Dr. Montez Moritaarter.  Patient is feeling much better with decreased right hand weakness and numbness.   Donnetta HutchingBrian Januel Doolan, MD 09/09/16 [phone]

## 2016-09-09 NOTE — H&P (Addendum)
History and Physical    Tyler Ruiz ZOX:[id number] DOB: [birth year] DOA: 09/09/2016  PCP: Everlean Cherry, MD [facility]: Dr. Clydie Braun in [facility]  Patient coming from: Home  Chief Complaint: Right forearm and hand numbness  HPI: Tyler Ruiz is a 70 y.o. gentleman with a history of paroxysmal atrial fibrillation (S/P cardioversion x 3, previously anticoagulated with Xarelto for about six months, NOT on chronic anticoagulation at this time) and HTN who presents to the ED at [facility] for evaluation of right upper extremity numbness.  The patient describes the acute onset of symptoms at [facility] on Monday morning.  No associated headache, vision disturbance, or actual weakness.  When symptoms persisted for several hours, he called his cardiologist.  Options for how to proceed with further evaluation were discussed.  The patient ultimately decided to schedule a follow-up appointment for today.  His RUE numbness persisted through the evening.  He did not take any additional medications besides his regular home medications, which include baby aspirin, in an attempt to alleviate his symptoms.  Symptoms had resolved when he awakened this morning, but they returned while he was attempting to do yard work.  His wife also noticed slurred speech (but no asymmetrical facial drooping), and the patient admits that he "generally was not feeling well".  This prompted presentation to the ED instead of waiting for his outpatient appointment.  He reports that his symptoms resolved around 5PM this evening.  He denies ever having significant hand or arm weakness.  No RLE deficits.  No headache.  No palpitations.  ED Course: Head CT actually suggested small acute infarct of the posterior left frontal cortex.  CT of the cervical spine shows significant degenerative disease at multiple levels.  MRI/MRA brain shows multiple acute focal infarcts of the left MCA territory suggestive of  thromboembolic disease.  He also has acute occlusion of the left M2 segment and high grade stenosis of the distal left M1 segment suggestive of thromboembolism.  The findings were discussed with the neuro hospitalist on-call for [facility], Dr. Roseanne Reno.  The patient is being admitted to [facility] for neurology consultation and further evaluation.  Review of Systems: As per HPI otherwise 10 point review of systems negative.    Past Medical History:  Diagnosis Date  . Atrial fibrillation (HCC)   . Dysrhythmia approx 2014   afib  . Hypertension   . Night muscle spasms    lower back and flank area  . Stroke [facility])    newly dx. 09/09/16    Past Surgical History:  Procedure Laterality Date  . CARDIOVERSION     x 2  . dental implant x 1 tooth    . DENTAL SURGERY    . nondisplaced right shoulder fx    . SKIN CANCER EXCISION    . skin cancer removal     from back - no further treatment     reports that he has never smoked. He has never used smokeless tobacco. He reports that he drinks alcohol. He reports that he does not use drugs.  He drinks beer once per week.  No history of EtOH dependence. He is married.  He is retired.  He has great grandchildren.  No Known Allergies  Family History  Problem Relation Age of Onset  . Stroke Mother     in age 5's  . AAA (abdominal aortic aneurysm) Father      Prior to Admission medications   Medication Sig Start Date End  Date Taking? Authorizing Provider  aspirin EC 81 MG tablet Take 81 mg by mouth at bedtime.    Yes Historical Provider, MD  cholecalciferol (VITAMIN D) 1000 units tablet Take 1,000 Units by mouth every other day.    Yes Historical Provider, MD  docusate sodium (COLACE) 100 MG capsule Take 100-200 mg by mouth daily as needed for mild constipation.   Yes Historical Provider, MD  flecainide (TAMBOCOR) 100 MG tablet Take 100 mg by mouth 2 (two) times daily. 12/04/15  Yes Historical Provider, MD  lisinopril (PRINIVIL,ZESTRIL) 40  MG tablet Take 20 mg by mouth daily.    Yes Historical Provider, MD  Multiple Vitamin (MULTIVITAMIN WITH MINERALS) TABS tablet Take 1 tablet by mouth daily.   Yes Historical Provider, MD  Omega-3 Fatty Acids (FISH OIL) 1000 MG CAPS Take 1,000 mg by mouth at bedtime.    Yes Historical Provider, MD    Physical Exam: Vitals:   09/09/16 1333 09/09/16 1835  BP: 144/89 146/96  Pulse: 67 71  Resp: 16 16  Temp: 98.7 F (37.1 C) 99.3 F (37.4 C)  TempSrc: Oral Oral  SpO2: 97% 98%  Weight:  100.7 kg (222 lb)  Height:  6\' 3"  (1.905 m)      Constitutional: NAD, calm, comfortable, NONtoxic appearing (actually wants to go home) Vitals:   09/09/16 1333 09/09/16 1835  BP: 144/89 146/96  Pulse: 67 71  Resp: 16 16  Temp: 98.7 F (37.1 C) 99.3 F (37.4 C)  TempSrc: Oral Oral  SpO2: 97% 98%  Weight:  100.7 kg (222 lb)  Height:  6\' 3"  (1.905 m)   Eyes: PERRL, lids and conjunctivae normal, no nystagmus ENMT: Mucous membranes are moist.  Normal dentition.  Tongue midline. Neck: normal appearance, supple, no masses Respiratory: clear to auscultation bilaterally, no wheezing, no crackles. Normal respiratory effort. No accessory muscle use.  Cardiovascular: Normal rate, regular rhythm, no murmurs / rubs / gallops. No extremity edema. 2+ pedal pulses. No carotid bruits.  GI: abdomen is soft and compressible.  No distention.  No tenderness.  No masses palpated.  Bowel sounds are present. Musculoskeletal:  No joint deformity in upper and lower extremities. Good ROM, no contractures. Normal muscle tone.  Skin: warm and dry but hyperpigmented at his ankles Neurologic: CN 2-12 grossly intact. Sensation intact, Strength symmetric bilaterally.  Fine motor and coordination intact.  No pronator drift. Psychiatric: Normal judgment and insight. Alert and oriented x 3. Normal mood.     Labs on Admission: I have personally reviewed following labs and imaging studies  CBC:  Recent Labs Lab  09/09/16 1410  WBC 7.3  HGB 15.0  HCT 43.6  MCV 86.0  PLT 190   Basic Metabolic Panel:  Recent Labs Lab 09/09/16 1410  NA 140  K 4.7  CL 105  CO2 29  GLUCOSE 91  BUN 16  CREATININE 1.22  CALCIUM 9.4   GFR: Estimated Creatinine Clearance: 69.3 mL/min (by C-G formula based on SCr of 1.22 mg/dL).  I-STAT troponin in the ED 0.00  Radiological Exams on Admission: Ct Head Wo Contrast  Result Date: 09/09/2016 CLINICAL DATA:  Right arm numbness. EXAM: CT HEAD WITHOUT CONTRAST TECHNIQUE: Contiguous axial images were obtained from the base of the skull through the vertex without intravenous contrast. COMPARISON:  12/31/2015 FINDINGS: Brain: Small focus of cortical blurring in the posterior left frontal region, possible acute infarct. Elsewhere negative brain without hemorrhage, hydrocephalus, or mass lesion. Vascular: Extensive atherosclerosis with probable generalized ectasia. No hyperdense  vessel sign. Skull: Normal. Negative for fracture or focal lesion. Sinuses/Orbits: No acute finding. Other: None. IMPRESSION: Suspected small acute infarct in the posterior left frontal cortex. MRI is suggested. Electronically Signed   By: Marnee Spring M.D.   On: 09/09/2016 14:30   Mr Brain Wo Contrast  Result Date: 09/09/2016 CLINICAL DATA:  RIGHT hand numbness.  History of hypertension. EXAM: MRI HEAD WITHOUT CONTRAST MRA HEAD WITHOUT CONTRAST MRI CERVICAL SPINE WITHOUT CONTRAST TECHNIQUE: Multiplanar, multiecho pulse sequences of the brain and surrounding structures were obtained without intravenous contrast. Angiographic images of the Circle of Willis were obtained using MRA technique without intravenous contrast. Multiplanar, multi echo pulse sequences of the cervical spine were obtained without contrast. COMPARISON:  CT HEAD September 09, 2016 1420 hours FINDINGS: MRI HEAD FINDINGS BRAIN: Multiple sub cm foci of reduced diffusion in LEFT frontal and parietal cortices, LEFT periatrial white  matter, and LEFT occipital lobe. Corresponding low ADC values in the larger foci. No susceptibility artifact to suggest hemorrhage. No susceptibility artifact to suggest hemorrhage. The ventricles and sulci are normal for patient's age. Patchy supratentorial white matter FLAIR T2 hyperintensities exclusive of the aforementioned abnormality compatible with mild chronic small vessel ischemic disease. No suspicious parenchymal signal, masses or mass effect. No abnormal extra-axial fluid collections. No extra-axial masses though, contrast enhanced sequences would be more sensitive. VASCULAR: Major dolicoectatic intracranial vascular flow voids present at skull base. SKULL AND UPPER CERVICAL SPINE: No abnormal sellar expansion. No suspicious calvarial bone marrow signal. Craniocervical junction maintained. SINUSES/ORBITS: Small RIGHT maxillary mucosal retention cyst. Mastoid air cells are well aerated. The included ocular globes and orbital contents are non-suspicious. OTHER: None. MRA HEAD FINDINGS ANTERIOR CIRCULATION: Dolicoectatic flow related enhancement of the included cervical, petrous, cavernous and supraclinoid internal carotid arteries. LEFT A1 segment developmentally dominant. Patent anterior communicating artery. Moderate to severe stenosis distal RIGHT M2 segment. Occluded LEFT M2 superior segment. Normal flow related enhancement of the anterior cerebral arteries, including distal segments. Mild luminal regularity. No aneurysm. POSTERIOR CIRCULATION: LEFT vertebral artery is dominant. Basilar artery is patent, with normal flow related enhancement of the main branch vessels. Robust RIGHT posterior communicating artery present. Flow related enhancement of the posterior cerebral arteries. Mild focal stenosis RIGHT P3 segment. Mild luminal irregularity. No large vessel occlusion, high-grade stenosis, aneurysm. MRI CERVICAL SPINE FINDINGS ALIGNMENT: Straightened cervical lordosis. Minimal grade 1 C4-5  anterolisthesis. VERTEBRAE/DISCS: Vertebral bodies are intact. Moderate C3-4 disc height loss, mild at C5-6. Decreased T2 signal within all disc compatible with desiccation. Moderate chronic discogenic endplate changes C5-6, mild at C3-4, C4-5 and C6-7. Bright STIR signal LEFT C6-7 facet. CORD:Cervical spinal cord is normal morphology and signal characteristics from the cervicomedullary junction to level of T1-2, the most caudal well visualized level. POSTERIOR FOSSA, VERTEBRAL ARTERIES, PARASPINAL TISSUES: No MR findings of ligamentous injury. Trace bright STIR signal about the LEFT C6-7 facet. Vertebral artery flow voids present. Included posterior fossa and paraspinal soft tissues are normal. DISC LEVELS: C2-3: Small broad-based central disc protrusion. Uncovertebral hypertrophy. Mild RIGHT facet arthropathy. No canal stenosis or neural foraminal narrowing. C3-4: Small broad-based disc bulge, uncovertebral hypertrophy. Moderate RIGHT, mild LEFT facet arthropathy. No canal stenosis. Severe RIGHT, moderate to severe LEFT neural foraminal narrowing. C4-5: Anterolisthesis. Annular bulging uncovertebral hypertrophy. Severe RIGHT, mild LEFT facet arthropathy. No canal stenosis. Moderate RIGHT neural foraminal narrowing. C5-6: Small broad-based disc bulge, uncovertebral hypertrophy. Moderate facet arthropathy. No canal stenosis. Moderate RIGHT, severe LEFT neural foraminal narrowing. C6-7: Annular bulging, uncovertebral hypertrophy. Moderate bilateral facet  arthropathy. No canal stenosis. Mild RIGHT neural foraminal narrowing. C7-T1: Small RIGHT central disc protrusion. Uncovertebral hypertrophy. Severe LEFT facet arthropathy, mild on the RIGHT. LEFT facet effusion is likely reactive. No canal stenosis or neural foraminal narrowing. 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 Mild chronic small vessel ischemic disease. MRA HEAD: Acute LEFT  M2 segment occlusion. High-grade stenosis distal LEFT M1 segment suggesting thromboembolism. MRI CERVICAL SPINE: Straightened cervical lordosis, grade 1 C4-5 anterolisthesis on degenerative basis. LEFT C7-T1 facet edema and effusion, likely reactive. Degenerative cervical spine without canal stenosis. Multilevel neural foraminal narrowing: Severe on the RIGHT at C3-4, severe on the LEFT at C5-6. Acute findings discussed with and reconfirmed by Dr.Knott on 09/09/2016 at 6:52 pm. Electronically Signed   By: Awilda Metro M.D.   On: 09/09/2016 18:53   Mr Cervical Spine Wo Contrast  Result Date: 09/09/2016 CLINICAL DATA:  RIGHT hand numbness.  History of hypertension. EXAM: MRI HEAD WITHOUT CONTRAST MRA HEAD WITHOUT CONTRAST MRI CERVICAL SPINE WITHOUT CONTRAST TECHNIQUE: Multiplanar, multiecho pulse sequences of the brain and surrounding structures were obtained without intravenous contrast. Angiographic images of the Circle of Willis were obtained using MRA technique without intravenous contrast. Multiplanar, multi echo pulse sequences of the cervical spine were obtained without contrast. COMPARISON:  CT HEAD September 09, 2016 1420 hours FINDINGS: MRI HEAD FINDINGS BRAIN: Multiple sub cm foci of reduced diffusion in LEFT frontal and parietal cortices, LEFT periatrial white matter, and LEFT occipital lobe. Corresponding low ADC values in the larger foci. No susceptibility artifact to suggest hemorrhage. No susceptibility artifact to suggest hemorrhage. The ventricles and sulci are normal for patient's age. Patchy supratentorial white matter FLAIR T2 hyperintensities exclusive of the aforementioned abnormality compatible with mild chronic small vessel ischemic disease. No suspicious parenchymal signal, masses or mass effect. No abnormal extra-axial fluid collections. No extra-axial masses though, contrast enhanced sequences would be more sensitive. VASCULAR: Major dolicoectatic intracranial vascular flow voids  present at skull base. SKULL AND UPPER CERVICAL SPINE: No abnormal sellar expansion. No suspicious calvarial bone marrow signal. Craniocervical junction maintained. SINUSES/ORBITS: Small RIGHT maxillary mucosal retention cyst. Mastoid air cells are well aerated. The included ocular globes and orbital contents are non-suspicious. OTHER: None. MRA HEAD FINDINGS ANTERIOR CIRCULATION: Dolicoectatic flow related enhancement of the included cervical, petrous, cavernous and supraclinoid internal carotid arteries. LEFT A1 segment developmentally dominant. Patent anterior communicating artery. Moderate to severe stenosis distal RIGHT M2 segment. Occluded LEFT M2 superior segment. Normal flow related enhancement of the anterior cerebral arteries, including distal segments. Mild luminal regularity. No aneurysm. POSTERIOR CIRCULATION: LEFT vertebral artery is dominant. Basilar artery is patent, with normal flow related enhancement of the main branch vessels. Robust RIGHT posterior communicating artery present. Flow related enhancement of the posterior cerebral arteries. Mild focal stenosis RIGHT P3 segment. Mild luminal irregularity. No large vessel occlusion, high-grade stenosis, aneurysm. MRI CERVICAL SPINE FINDINGS ALIGNMENT: Straightened cervical lordosis. Minimal grade 1 C4-5 anterolisthesis. VERTEBRAE/DISCS: Vertebral bodies are intact. Moderate C3-4 disc height loss, mild at C5-6. Decreased T2 signal within all disc compatible with desiccation. Moderate chronic discogenic endplate changes C5-6, mild at C3-4, C4-5 and C6-7. Bright STIR signal LEFT C6-7 facet. CORD:Cervical spinal cord is normal morphology and signal characteristics from the cervicomedullary junction to level of T1-2, the most caudal well visualized level. POSTERIOR FOSSA, VERTEBRAL ARTERIES, PARASPINAL TISSUES: No MR findings of ligamentous injury. Trace bright STIR signal about the LEFT C6-7 facet. Vertebral artery flow voids present.  Included posterior  fossa and paraspinal soft tissues are normal. DISC LEVELS: C2-3: Small broad-based central disc protrusion. Uncovertebral hypertrophy. Mild RIGHT facet arthropathy. No canal stenosis or neural foraminal narrowing. C3-4: Small broad-based disc bulge, uncovertebral hypertrophy. Moderate RIGHT, mild LEFT facet arthropathy. No canal stenosis. Severe RIGHT, moderate to severe LEFT neural foraminal narrowing. C4-5: Anterolisthesis. Annular bulging uncovertebral hypertrophy. Severe RIGHT, mild LEFT facet arthropathy. No canal stenosis. Moderate RIGHT neural foraminal narrowing. C5-6: Small broad-based disc bulge, uncovertebral hypertrophy. Moderate facet arthropathy. No canal stenosis. Moderate RIGHT, severe LEFT neural foraminal narrowing. C6-7: Annular bulging, uncovertebral hypertrophy. Moderate bilateral facet arthropathy. No canal stenosis. Mild RIGHT neural foraminal narrowing. C7-T1: Small RIGHT central disc protrusion. Uncovertebral hypertrophy. Severe LEFT facet arthropathy, mild on the RIGHT. LEFT facet effusion is likely reactive. No canal stenosis or neural foraminal narrowing. 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 Mild chronic small vessel ischemic disease. MRA HEAD: Acute LEFT M2 segment occlusion. High-grade stenosis distal LEFT M1 segment suggesting thromboembolism. MRI CERVICAL SPINE: Straightened cervical lordosis, grade 1 C4-5 anterolisthesis on degenerative basis. LEFT C7-T1 facet edema and effusion, likely reactive. Degenerative cervical spine without canal stenosis. Multilevel neural foraminal narrowing: Severe on the RIGHT at C3-4, severe on the LEFT at C5-6. Acute findings discussed with and reconfirmed by Dr.Knott on 09/09/2016 at 6:52 pm. Electronically Signed   By: Awilda Metro M.D.   On: 09/09/2016 18:53   Mr Maxine Glenn Head/brain UJ Cm  Result Date: 09/09/2016 CLINICAL DATA:  RIGHT hand numbness.  History of  hypertension. EXAM: MRI HEAD WITHOUT CONTRAST MRA HEAD WITHOUT CONTRAST MRI CERVICAL SPINE WITHOUT CONTRAST TECHNIQUE: Multiplanar, multiecho pulse sequences of the brain and surrounding structures were obtained without intravenous contrast. Angiographic images of the Circle of Willis were obtained using MRA technique without intravenous contrast. Multiplanar, multi echo pulse sequences of the cervical spine were obtained without contrast. COMPARISON:  CT HEAD September 09, 2016 1420 hours FINDINGS: MRI HEAD FINDINGS BRAIN: Multiple sub cm foci of reduced diffusion in LEFT frontal and parietal cortices, LEFT periatrial white matter, and LEFT occipital lobe. Corresponding low ADC values in the larger foci. No susceptibility artifact to suggest hemorrhage. No susceptibility artifact to suggest hemorrhage. The ventricles and sulci are normal for patient's age. Patchy supratentorial white matter FLAIR T2 hyperintensities exclusive of the aforementioned abnormality compatible with mild chronic small vessel ischemic disease. No suspicious parenchymal signal, masses or mass effect. No abnormal extra-axial fluid collections. No extra-axial masses though, contrast enhanced sequences would be more sensitive. VASCULAR: Major dolicoectatic intracranial vascular flow voids present at skull base. SKULL AND UPPER CERVICAL SPINE: No abnormal sellar expansion. No suspicious calvarial bone marrow signal. Craniocervical junction maintained. SINUSES/ORBITS: Small RIGHT maxillary mucosal retention cyst. Mastoid air cells are well aerated. The included ocular globes and orbital contents are non-suspicious. OTHER: None. MRA HEAD FINDINGS ANTERIOR CIRCULATION: Dolicoectatic flow related enhancement of the included cervical, petrous, cavernous and supraclinoid internal carotid arteries. LEFT A1 segment developmentally dominant. Patent anterior communicating artery. Moderate to severe stenosis distal RIGHT M2 segment. Occluded LEFT M2  superior segment. Normal flow related enhancement of the anterior cerebral arteries, including distal segments. Mild luminal regularity. No aneurysm. POSTERIOR CIRCULATION: LEFT vertebral artery is dominant. Basilar artery is patent, with normal flow related enhancement of the main branch vessels. Robust RIGHT posterior communicating artery present. Flow related enhancement of the posterior cerebral arteries. Mild focal stenosis RIGHT P3 segment. Mild luminal irregularity. No large vessel occlusion, high-grade  stenosis, aneurysm. MRI CERVICAL SPINE FINDINGS ALIGNMENT: Straightened cervical lordosis. Minimal grade 1 C4-5 anterolisthesis. VERTEBRAE/DISCS: Vertebral bodies are intact. Moderate C3-4 disc height loss, mild at C5-6. Decreased T2 signal within all disc compatible with desiccation. Moderate chronic discogenic endplate changes C5-6, mild at C3-4, C4-5 and C6-7. Bright STIR signal LEFT C6-7 facet. CORD:Cervical spinal cord is normal morphology and signal characteristics from the cervicomedullary junction to level of T1-2, the most caudal well visualized level. POSTERIOR FOSSA, VERTEBRAL ARTERIES, PARASPINAL TISSUES: No MR findings of ligamentous injury. Trace bright STIR signal about the LEFT C6-7 facet. Vertebral artery flow voids present. Included posterior fossa and paraspinal soft tissues are normal. DISC LEVELS: C2-3: Small broad-based central disc protrusion. Uncovertebral hypertrophy. Mild RIGHT facet arthropathy. No canal stenosis or neural foraminal narrowing. C3-4: Small broad-based disc bulge, uncovertebral hypertrophy. Moderate RIGHT, mild LEFT facet arthropathy. No canal stenosis. Severe RIGHT, moderate to severe LEFT neural foraminal narrowing. C4-5: Anterolisthesis. Annular bulging uncovertebral hypertrophy. Severe RIGHT, mild LEFT facet arthropathy. No canal stenosis. Moderate RIGHT neural foraminal narrowing. C5-6: Small broad-based disc bulge, uncovertebral hypertrophy. Moderate facet  arthropathy. No canal stenosis. Moderate RIGHT, severe LEFT neural foraminal narrowing. C6-7: Annular bulging, uncovertebral hypertrophy. Moderate bilateral facet arthropathy. No canal stenosis. Mild RIGHT neural foraminal narrowing. C7-T1: Small RIGHT central disc protrusion. Uncovertebral hypertrophy. Severe LEFT facet arthropathy, mild on the RIGHT. LEFT facet effusion is likely reactive. No canal stenosis or neural foraminal narrowing. 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 Mild chronic small vessel ischemic disease. MRA HEAD: Acute LEFT M2 segment occlusion. High-grade stenosis distal LEFT M1 segment suggesting thromboembolism. MRI CERVICAL SPINE: Straightened cervical lordosis, grade 1 C4-5 anterolisthesis on degenerative basis. LEFT C7-T1 facet edema and effusion, likely reactive. Degenerative cervical spine without canal stenosis. Multilevel neural foraminal narrowing: Severe on the RIGHT at C3-4, severe on the LEFT at C5-6. Acute findings discussed with and reconfirmed by Dr.Knott on 09/09/2016 at 6:52 pm. Electronically Signed   By: Awilda Metro M.D.   On: 09/09/2016 18:53    EKG: Independently reviewed. NSR.  Flat T waves.  No acute ST segment changes.  Assessment/Plan Principal Problem:   Acute CVA (cerebrovascular accident) (HCC) Active Problems:   HTN (hypertension)   Cervical disc disease   Atrial fibrillation (HCC)      Acute CVA, deficits resolved.  Likely thromboembolic based on imaging. --Admit to inpatient, telemetry --Neurology to see th patient upon arrival to [facility] --Will start FULL strength aspirin for now, next dose in the AM, pending new recommendations from neurology --heart health diet if he passes swallow screen --PT/OT/Speech evaluations per protocol --A1c and lipid panel in the AM for risk factor stratification --Carotid ultrasound and complete echo in the AM --Expect  he will need to resume full anticoagulation; will need to discuss timing with neurology.  The patient may also want [place] to discuss this with his cardiologist.  Paroxysmal atrial fibrillation --Now has CHADS-Vasc score of 4, will need to resume long-term anticoagulation, as noted above, provided there are no contraindications --Continue home dose of flecainide --Telemetry monitoring --Consider event monitor as outpatient  HTN --Continue home dose of lisinopril  Cervical disc disease --Outpatient orthopedic or neurosurgical referral.  DVT prophylaxis: SCDs Code Status: FULL Family Communication: Wife present in the ED at [facility] at time of admission Disposition Plan: Expect he will go home at discharge. Consults called: Neurology Admission status: Inpatient, telemetry   TIME SPENT: 75 minutes  Jerene Bearsarter,Mariea Mcmartin Harrison MD [facility] Pager [phone]  If 7PM-7AM, please contact night-coverage [email or web address] Password [email or web address]  09/09/2016, 9:54 PM

## 2016-09-09 NOTE — ED Notes (Signed)
Patient transported to CT 

## 2016-09-09 NOTE — ED Provider Notes (Signed)
[facility]-EMERGENCY DEPT Provider Note   CSN: [id number] Arrival date & time: 09/09/16  1321     History   Chief Complaint Chief Complaint  Patient presents with  . Numbness    HPI Tyler Ruiz is a 70 y.o. male.  Pt said that he woke up with right hand numbness yesterday.  He said that it did radiate up his arm.  He said that he made an appt with his doctor for today, but then felt fine, then cancelled it.  He said that he hit his right hand on something outside and the numbness returned.  The pt said that he has had some difficulty writing with his right hand and holding onto things.  Pt felt a little confused today, but that has improved.  Pt took his ASA like usual last night.      Past Medical History:  Diagnosis Date  . Atrial fibrillation (HCC)   . Dysrhythmia approx 2014   afib  . Hypertension   . Night muscle spasms    lower back and flank area  . Stroke [facility])    newly dx. 09/09/16    Patient Active Problem List   Diagnosis Date Noted  . Acute CVA (cerebrovascular accident) (HCC) 09/09/2016  . HTN (hypertension) 09/09/2016  . Cervical disc disease 09/09/2016  . Atrial fibrillation (HCC) 09/09/2016    Past Surgical History:  Procedure Laterality Date  . CARDIOVERSION     x 2  . dental implant x 1 tooth    . DENTAL SURGERY    . nondisplaced right shoulder fx    . SKIN CANCER EXCISION    . skin cancer removal     from back - no further treatment       Home Medications    Prior to Admission medications   Medication Sig Start Date End Date Taking? Authorizing Provider  aspirin EC 81 MG tablet Take 81 mg by mouth at bedtime.    Yes Historical Provider, MD  cholecalciferol (VITAMIN D) 1000 units tablet Take 1,000 Units by mouth every other day.    Yes Historical Provider, MD  docusate sodium (COLACE) 100 MG capsule Take 100-200 mg by mouth daily as needed for mild constipation.   Yes Historical Provider, MD  flecainide (TAMBOCOR) 100 MG tablet Take 100  mg by mouth 2 (two) times daily. 12/04/15  Yes Historical Provider, MD  lisinopril (PRINIVIL,ZESTRIL) 40 MG tablet Take 20 mg by mouth daily.    Yes Historical Provider, MD  Multiple Vitamin (MULTIVITAMIN WITH MINERALS) TABS tablet Take 1 tablet by mouth daily.   Yes Historical Provider, MD  Omega-3 Fatty Acids (FISH OIL) 1000 MG CAPS Take 1,000 mg by mouth at bedtime.    Yes Historical Provider, MD    Family History Family History  Problem Relation Age of Onset  . Stroke Mother     in age 23's  . AAA (abdominal aortic aneurysm) Father     Social History Social History  Substance Use Topics  . Smoking status: Never Smoker  . Smokeless tobacco: Never Used  . Alcohol use Yes     Comment: approx. 1 beer every week     Allergies   Review of patient's allergies indicates no known allergies.   Review of Systems Review of Systems  Neurological: Positive for numbness.  All other systems reviewed and are negative.    Physical Exam Updated Vital Signs BP (!) 143/89   Pulse 68   Temp 98.4 F (36.9 C) (Axillary)  Resp 17   Ht 6\' 3"  (1.905 m)   Wt 227 lb 8 oz (103.2 kg)   SpO2 96%   BMI 28.44 kg/m   Physical Exam  Constitutional: He is oriented to person, place, and time. He appears well-developed and well-nourished.  HENT:  Head: Normocephalic and atraumatic.  Right Ear: External ear normal.  Left Ear: External ear normal.  Nose: Nose normal.  Mouth/Throat: Oropharynx is clear and moist.  Eyes: Conjunctivae and EOM are normal. Pupils are equal, round, and reactive to light.  Neck: Normal range of motion. Neck supple.  Cardiovascular: Normal rate, regular rhythm, normal heart sounds and intact distal pulses.   Pulmonary/Chest: Effort normal and breath sounds normal.  Abdominal: Soft. Bowel sounds are normal.  Musculoskeletal: Normal range of motion.  Neurological: He is alert and oriented to person, place, and time. He has normal reflexes.  Skin: Skin is warm and  dry.  Psychiatric: He has a normal mood and affect. His behavior is normal. Judgment and thought content normal.  Nursing note and vitals reviewed.    ED Treatments / Results  Labs (all labs ordered are listed, but only abnormal results are displayed) Labs Reviewed  BASIC METABOLIC PANEL - Abnormal; Notable for the following:       Result Value   GFR calc non Af Amer 59 (*)    All other components within normal limits  HEPATIC FUNCTION PANEL - Abnormal; Notable for the following:    Total Protein 6.1 (*)    ALT 14 (*)    Indirect Bilirubin 1.0 (*)    All other components within normal limits  LIPID PANEL - Abnormal; Notable for the following:    Cholesterol 208 (*)    HDL 40 (*)    LDL Cholesterol 149 (*)    All other components within normal limits  CBC  HEMOGLOBIN A1C  I-STAT TROPOININ, ED    EKG  EKG Interpretation  Date/Time:  Tuesday September 09 2016 13:30:52 EDT Ventricular Rate:  67 PR Interval:    QRS Duration: 110 QT Interval:  438 QTC Calculation: 463 R Axis:   27 Text Interpretation:  Sinus rhythm Abnormal R-wave progression, early transition Borderline T wave abnormalities Confirmed by Particia Nearing MD, Alayiah Fontes ([id number]) on 09/09/2016 2:08:56 PM       Radiology Ct Head Wo Contrast  Result Date: 09/09/2016 CLINICAL DATA:  Right arm numbness. EXAM: CT HEAD WITHOUT CONTRAST TECHNIQUE: Contiguous axial images were obtained from the base of the skull through the vertex without intravenous contrast. COMPARISON:  12/31/2015 FINDINGS: Brain: Small focus of cortical blurring in the posterior left frontal region, possible acute infarct. Elsewhere negative brain without hemorrhage, hydrocephalus, or mass lesion. Vascular: Extensive atherosclerosis with probable generalized ectasia. No hyperdense vessel sign. Skull: Normal. Negative for fracture or focal lesion. Sinuses/Orbits: No acute finding. Other: None. IMPRESSION: Suspected small acute infarct in the posterior left frontal  cortex. MRI is suggested. Electronically Signed   By: Marnee Spring M.D.   On: 09/09/2016 14:30   Mr Brain Wo Contrast  Result Date: 09/09/2016 CLINICAL DATA:  RIGHT hand numbness.  History of hypertension. EXAM: MRI HEAD WITHOUT CONTRAST MRA HEAD WITHOUT CONTRAST MRI CERVICAL SPINE WITHOUT CONTRAST TECHNIQUE: Multiplanar, multiecho pulse sequences of the brain and surrounding structures were obtained without intravenous contrast. Angiographic images of the Circle of Willis were obtained using MRA technique without intravenous contrast. Multiplanar, multi echo pulse sequences of the cervical spine were obtained without contrast. COMPARISON:  CT HEAD September 09, 2016  1420 hours FINDINGS: MRI HEAD FINDINGS BRAIN: Multiple sub cm foci of reduced diffusion in LEFT frontal and parietal cortices, LEFT periatrial white matter, and LEFT occipital lobe. Corresponding low ADC values in the larger foci. No susceptibility artifact to suggest hemorrhage. No susceptibility artifact to suggest hemorrhage. The ventricles and sulci are normal for patient's age. Patchy supratentorial white matter FLAIR T2 hyperintensities exclusive of the aforementioned abnormality compatible with mild chronic small vessel ischemic disease. No suspicious parenchymal signal, masses or mass effect. No abnormal extra-axial fluid collections. No extra-axial masses though, contrast enhanced sequences would be more sensitive. VASCULAR: Major dolicoectatic intracranial vascular flow voids present at skull base. SKULL AND UPPER CERVICAL SPINE: No abnormal sellar expansion. No suspicious calvarial bone marrow signal. Craniocervical junction maintained. SINUSES/ORBITS: Small RIGHT maxillary mucosal retention cyst. Mastoid air cells are well aerated. The included ocular globes and orbital contents are non-suspicious. OTHER: None. MRA HEAD FINDINGS ANTERIOR CIRCULATION: Dolicoectatic flow related enhancement of the included cervical, petrous, cavernous  and supraclinoid internal carotid arteries. LEFT A1 segment developmentally dominant. Patent anterior communicating artery. Moderate to severe stenosis distal RIGHT M2 segment. Occluded LEFT M2 superior segment. Normal flow related enhancement of the anterior cerebral arteries, including distal segments. Mild luminal regularity. No aneurysm. POSTERIOR CIRCULATION: LEFT vertebral artery is dominant. Basilar artery is patent, with normal flow related enhancement of the main branch vessels. Robust RIGHT posterior communicating artery present. Flow related enhancement of the posterior cerebral arteries. Mild focal stenosis RIGHT P3 segment. Mild luminal irregularity. No large vessel occlusion, high-grade stenosis, aneurysm. MRI CERVICAL SPINE FINDINGS ALIGNMENT: Straightened cervical lordosis. Minimal grade 1 C4-5 anterolisthesis. VERTEBRAE/DISCS: Vertebral bodies are intact. Moderate C3-4 disc height loss, mild at C5-6. Decreased T2 signal within all disc compatible with desiccation. Moderate chronic discogenic endplate changes C5-6, mild at C3-4, C4-5 and C6-7. Bright STIR signal LEFT C6-7 facet. CORD:Cervical spinal cord is normal morphology and signal characteristics from the cervicomedullary junction to level of T1-2, the most caudal well visualized level. POSTERIOR FOSSA, VERTEBRAL ARTERIES, PARASPINAL TISSUES: No MR findings of ligamentous injury. Trace bright STIR signal about the LEFT C6-7 facet. Vertebral artery flow voids present. Included posterior fossa and paraspinal soft tissues are normal. DISC LEVELS: C2-3: Small broad-based central disc protrusion. Uncovertebral hypertrophy. Mild RIGHT facet arthropathy. No canal stenosis or neural foraminal narrowing. C3-4: Small broad-based disc bulge, uncovertebral hypertrophy. Moderate RIGHT, mild LEFT facet arthropathy. No canal stenosis. Severe RIGHT, moderate to severe LEFT neural foraminal narrowing. C4-5: Anterolisthesis. Annular bulging uncovertebral  hypertrophy. Severe RIGHT, mild LEFT facet arthropathy. No canal stenosis. Moderate RIGHT neural foraminal narrowing. C5-6: Small broad-based disc bulge, uncovertebral hypertrophy. Moderate facet arthropathy. No canal stenosis. Moderate RIGHT, severe LEFT neural foraminal narrowing. C6-7: Annular bulging, uncovertebral hypertrophy. Moderate bilateral facet arthropathy. No canal stenosis. Mild RIGHT neural foraminal narrowing. C7-T1: Small RIGHT central disc protrusion. Uncovertebral hypertrophy. Severe LEFT facet arthropathy, mild on the RIGHT. LEFT facet effusion is likely reactive. No canal stenosis or neural foraminal narrowing. 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 Mild chronic small vessel ischemic disease. MRA HEAD: Acute LEFT M2 segment occlusion. High-grade stenosis distal LEFT M1 segment suggesting thromboembolism. MRI CERVICAL SPINE: Straightened cervical lordosis, grade 1 C4-5 anterolisthesis on degenerative basis. LEFT C7-T1 facet edema and effusion, likely reactive. Degenerative cervical spine without canal stenosis. Multilevel neural foraminal narrowing: Severe on the RIGHT at C3-4, severe on the LEFT at C5-6. Acute findings discussed with and reconfirmed by Dr.Knott on 09/09/2016  at 6:52 pm. Electronically Signed   By: Awilda Metro M.D.   On: 09/09/2016 18:53   Mr Cervical Spine Wo Contrast  Result Date: 09/09/2016 CLINICAL DATA:  RIGHT hand numbness.  History of hypertension. EXAM: MRI HEAD WITHOUT CONTRAST MRA HEAD WITHOUT CONTRAST MRI CERVICAL SPINE WITHOUT CONTRAST TECHNIQUE: Multiplanar, multiecho pulse sequences of the brain and surrounding structures were obtained without intravenous contrast. Angiographic images of the Circle of Willis were obtained using MRA technique without intravenous contrast. Multiplanar, multi echo pulse sequences of the cervical spine were obtained without contrast.  COMPARISON:  CT HEAD September 09, 2016 1420 hours FINDINGS: MRI HEAD FINDINGS BRAIN: Multiple sub cm foci of reduced diffusion in LEFT frontal and parietal cortices, LEFT periatrial white matter, and LEFT occipital lobe. Corresponding low ADC values in the larger foci. No susceptibility artifact to suggest hemorrhage. No susceptibility artifact to suggest hemorrhage. The ventricles and sulci are normal for patient's age. Patchy supratentorial white matter FLAIR T2 hyperintensities exclusive of the aforementioned abnormality compatible with mild chronic small vessel ischemic disease. No suspicious parenchymal signal, masses or mass effect. No abnormal extra-axial fluid collections. No extra-axial masses though, contrast enhanced sequences would be more sensitive. VASCULAR: Major dolicoectatic intracranial vascular flow voids present at skull base. SKULL AND UPPER CERVICAL SPINE: No abnormal sellar expansion. No suspicious calvarial bone marrow signal. Craniocervical junction maintained. SINUSES/ORBITS: Small RIGHT maxillary mucosal retention cyst. Mastoid air cells are well aerated. The included ocular globes and orbital contents are non-suspicious. OTHER: None. MRA HEAD FINDINGS ANTERIOR CIRCULATION: Dolicoectatic flow related enhancement of the included cervical, petrous, cavernous and supraclinoid internal carotid arteries. LEFT A1 segment developmentally dominant. Patent anterior communicating artery. Moderate to severe stenosis distal RIGHT M2 segment. Occluded LEFT M2 superior segment. Normal flow related enhancement of the anterior cerebral arteries, including distal segments. Mild luminal regularity. No aneurysm. POSTERIOR CIRCULATION: LEFT vertebral artery is dominant. Basilar artery is patent, with normal flow related enhancement of the main branch vessels. Robust RIGHT posterior communicating artery present. Flow related enhancement of the posterior cerebral arteries. Mild focal stenosis RIGHT P3 segment.  Mild luminal irregularity. No large vessel occlusion, high-grade stenosis, aneurysm. MRI CERVICAL SPINE FINDINGS ALIGNMENT: Straightened cervical lordosis. Minimal grade 1 C4-5 anterolisthesis. VERTEBRAE/DISCS: Vertebral bodies are intact. Moderate C3-4 disc height loss, mild at C5-6. Decreased T2 signal within all disc compatible with desiccation. Moderate chronic discogenic endplate changes C5-6, mild at C3-4, C4-5 and C6-7. Bright STIR signal LEFT C6-7 facet. CORD:Cervical spinal cord is normal morphology and signal characteristics from the cervicomedullary junction to level of T1-2, the most caudal well visualized level. POSTERIOR FOSSA, VERTEBRAL ARTERIES, PARASPINAL TISSUES: No MR findings of ligamentous injury. Trace bright STIR signal about the LEFT C6-7 facet. Vertebral artery flow voids present. Included posterior fossa and paraspinal soft tissues are normal. DISC LEVELS: C2-3: Small broad-based central disc protrusion. Uncovertebral hypertrophy. Mild RIGHT facet arthropathy. No canal stenosis or neural foraminal narrowing. C3-4: Small broad-based disc bulge, uncovertebral hypertrophy. Moderate RIGHT, mild LEFT facet arthropathy. No canal stenosis. Severe RIGHT, moderate to severe LEFT neural foraminal narrowing. C4-5: Anterolisthesis. Annular bulging uncovertebral hypertrophy. Severe RIGHT, mild LEFT facet arthropathy. No canal stenosis. Moderate RIGHT neural foraminal narrowing. C5-6: Small broad-based disc bulge, uncovertebral hypertrophy. Moderate facet arthropathy. No canal stenosis. Moderate RIGHT, severe LEFT neural foraminal narrowing. C6-7: Annular bulging, uncovertebral hypertrophy. Moderate bilateral facet arthropathy. No canal stenosis. Mild RIGHT neural foraminal narrowing. C7-T1: Small RIGHT central disc protrusion. Uncovertebral hypertrophy. Severe LEFT facet arthropathy, mild on the RIGHT. LEFT  facet effusion is likely reactive. No canal stenosis or neural foraminal narrowing. IMPRESSION:  MRI HEAD: Multiple acute sub cm LEFT MCA territory and left watershed infarcts consistent with thromboembolism. Mild chronic small vessel ischemic disease. MRA HEAD: Acute LEFT M2 segment occlusion. High-grade stenosis distal LEFT M1 segment suggesting thromboembolism. MRI CERVICAL SPINE: Straightened cervical lordosis, grade 1 C4-5 anterolisthesis on degenerative basis. LEFT C7-T1 facet edema and effusion, likely reactive. Degenerative cervical spine without canal stenosis. Multilevel neural foraminal narrowing: Severe on the RIGHT at C3-4, severe on the LEFT at C5-6. Acute findings discussed with and reconfirmed by Dr.Knott on 09/09/2016 at 6:52 pm. Electronically Signed   By: Awilda Metro M.D.   On: 09/09/2016 18:53   Mr Maxine Glenn Head/brain ZO Cm  Result Date: 09/09/2016 CLINICAL DATA:  RIGHT hand numbness.  History of hypertension. EXAM: MRI HEAD WITHOUT CONTRAST MRA HEAD WITHOUT CONTRAST MRI CERVICAL SPINE WITHOUT CONTRAST TECHNIQUE: Multiplanar, multiecho pulse sequences of the brain and surrounding structures were obtained without intravenous contrast. Angiographic images of the Circle of Willis were obtained using MRA technique without intravenous contrast. Multiplanar, multi echo pulse sequences of the cervical spine were obtained without contrast. COMPARISON:  CT HEAD September 09, 2016 1420 hours FINDINGS: MRI HEAD FINDINGS BRAIN: Multiple sub cm foci of reduced diffusion in LEFT frontal and parietal cortices, LEFT periatrial white matter, and LEFT occipital lobe. Corresponding low ADC values in the larger foci. No susceptibility artifact to suggest hemorrhage. No susceptibility artifact to suggest hemorrhage. The ventricles and sulci are normal for patient's age. Patchy supratentorial white matter FLAIR T2 hyperintensities exclusive of the aforementioned abnormality compatible with mild chronic small vessel ischemic disease. No suspicious parenchymal signal, masses or mass effect. No abnormal  extra-axial fluid collections. No extra-axial masses though, contrast enhanced sequences would be more sensitive. VASCULAR: Major dolicoectatic intracranial vascular flow voids present at skull base. SKULL AND UPPER CERVICAL SPINE: No abnormal sellar expansion. No suspicious calvarial bone marrow signal. Craniocervical junction maintained. SINUSES/ORBITS: Small RIGHT maxillary mucosal retention cyst. Mastoid air cells are well aerated. The included ocular globes and orbital contents are non-suspicious. OTHER: None. MRA HEAD FINDINGS ANTERIOR CIRCULATION: Dolicoectatic flow related enhancement of the included cervical, petrous, cavernous and supraclinoid internal carotid arteries. LEFT A1 segment developmentally dominant. Patent anterior communicating artery. Moderate to severe stenosis distal RIGHT M2 segment. Occluded LEFT M2 superior segment. Normal flow related enhancement of the anterior cerebral arteries, including distal segments. Mild luminal regularity. No aneurysm. POSTERIOR CIRCULATION: LEFT vertebral artery is dominant. Basilar artery is patent, with normal flow related enhancement of the main branch vessels. Robust RIGHT posterior communicating artery present. Flow related enhancement of the posterior cerebral arteries. Mild focal stenosis RIGHT P3 segment. Mild luminal irregularity. No large vessel occlusion, high-grade stenosis, aneurysm. MRI CERVICAL SPINE FINDINGS ALIGNMENT: Straightened cervical lordosis. Minimal grade 1 C4-5 anterolisthesis. VERTEBRAE/DISCS: Vertebral bodies are intact. Moderate C3-4 disc height loss, mild at C5-6. Decreased T2 signal within all disc compatible with desiccation. Moderate chronic discogenic endplate changes C5-6, mild at C3-4, C4-5 and C6-7. Bright STIR signal LEFT C6-7 facet. CORD:Cervical spinal cord is normal morphology and signal characteristics from the cervicomedullary junction to level of T1-2, the most caudal well visualized level. POSTERIOR FOSSA, VERTEBRAL  ARTERIES, PARASPINAL TISSUES: No MR findings of ligamentous injury. Trace bright STIR signal about the LEFT C6-7 facet. Vertebral artery flow voids present. Included posterior fossa and paraspinal soft tissues are normal. DISC LEVELS: C2-3: Small broad-based central disc protrusion. Uncovertebral hypertrophy. Mild RIGHT facet arthropathy. No canal  stenosis or neural foraminal narrowing. C3-4: Small broad-based disc bulge, uncovertebral hypertrophy. Moderate RIGHT, mild LEFT facet arthropathy. No canal stenosis. Severe RIGHT, moderate to severe LEFT neural foraminal narrowing. C4-5: Anterolisthesis. Annular bulging uncovertebral hypertrophy. Severe RIGHT, mild LEFT facet arthropathy. No canal stenosis. Moderate RIGHT neural foraminal narrowing. C5-6: Small broad-based disc bulge, uncovertebral hypertrophy. Moderate facet arthropathy. No canal stenosis. Moderate RIGHT, severe LEFT neural foraminal narrowing. C6-7: Annular bulging, uncovertebral hypertrophy. Moderate bilateral facet arthropathy. No canal stenosis. Mild RIGHT neural foraminal narrowing. C7-T1: Small RIGHT central disc protrusion. Uncovertebral hypertrophy. Severe LEFT facet arthropathy, mild on the RIGHT. LEFT facet effusion is likely reactive. No canal stenosis or neural foraminal narrowing. 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 Mild chronic small vessel ischemic disease. MRA HEAD: Acute LEFT M2 segment occlusion. High-grade stenosis distal LEFT M1 segment suggesting thromboembolism. MRI CERVICAL SPINE: Straightened cervical lordosis, grade 1 C4-5 anterolisthesis on degenerative basis. LEFT C7-T1 facet edema and effusion, likely reactive. Degenerative cervical spine without canal stenosis. Multilevel neural foraminal narrowing: Severe on the RIGHT at C3-4, severe on the LEFT at C5-6. Acute findings discussed with and reconfirmed by Dr.Knott on 09/09/2016 at 6:52 pm.  Electronically Signed   By: Awilda Metro M.D.   On: 09/09/2016 18:53    Procedures Procedures (including critical care time)  Medications Ordered in ED Medications  pneumococcal 23 valent vaccine (PNU-IMMUNE) injection 0.5 mL (0.5 mLs Intramuscular Not Given 09/10/16 0824)  flecainide (TAMBOCOR) tablet 100 mg (100 mg Oral Given 09/10/16 1610)  acetaminophen (TYLENOL) tablet 650 mg (not administered)    Or  acetaminophen (TYLENOL) suppository 650 mg (not administered)  aspirin suppository 300 mg ( Rectal See Alternative 09/10/16 9604)    Or  aspirin tablet 325 mg (325 mg Oral Given 09/10/16 0823)  heparin injection 5,000 Units (not administered)  atorvastatin (LIPITOR) tablet 40 mg (not administered)  hydrALAZINE (APRESOLINE) injection 10 mg (not administered)   stroke: mapping our early stages of recovery book ( Does not apply Given 09/10/16 0300)     Initial Impression / Assessment and Plan / ED Course  I have reviewed the triage vital signs and the nursing notes.  Pertinent labs & imaging results that were available during my care of the patient were reviewed by me and considered in my medical decision making (see chart for details).  Clinical Course   MRI pending at the time of shift change.  Pt will be signed out to Dr. Adriana Simas.  Final Clinical Impressions(s) / ED Diagnoses   Final diagnoses:  Paresthesia    New Prescriptions Current Discharge Medication List       Jacalyn Lefevre, MD 09/10/16 1023

## 2016-09-09 NOTE — ED Triage Notes (Addendum)
Pt c/o numbness to left hand onset yesterday. Today numbness radiates up into arm. Some confusion new onset today after waking up at 0600. Difficulty writing with hand, states trouble is with hand not working rather than brain. Last seen normal 0600. Hx of a-fib: resolved, not on anticoagulation.

## 2016-09-09 NOTE — ED Notes (Signed)
Patient transported to MRI 

## 2016-09-09 NOTE — ED Notes (Signed)
Pt refused to have peripheral iv inserted stating "I am 100% against it".

## 2016-09-09 NOTE — ED Notes (Signed)
MD at bedside. 

## 2016-09-10 ENCOUNTER — Inpatient Hospital Stay (HOSPITAL_COMMUNITY): Payer: Medicare HMO

## 2016-09-10 DIAGNOSIS — I639 Cerebral infarction, unspecified: Secondary | ICD-10-CM

## 2016-09-10 DIAGNOSIS — I6789 Other cerebrovascular disease: Secondary | ICD-10-CM

## 2016-09-10 LAB — LIPID PANEL
Cholesterol: 208 mg/dL — ABNORMAL HIGH (ref 0–200)
HDL: 40 mg/dL — AB (ref >40)
LDL Cholesterol: 149 mg/dL — ABNORMAL HIGH (ref 0–99)
Total CHOL/HDL Ratio: 5.2 RATIO
Triglycerides: 97 mg/dL (ref <150)
VLDL: 19 mg/dL (ref 0–40)

## 2016-09-10 LAB — ECHOCARDIOGRAM COMPLETE
CHL CUP MV DEC (S): 268
E decel time: 268 msec
E/e' ratio: 7.53
FS: 40 % (ref 28–44)
HEIGHTINCHES: 75 in
IV/PV OW: 0.94
LA vol: 79.5 mL
LADIAMINDEX: 1.77 cm/m2
LASIZE: 41 mm
LAVOLA4C: 76.6 mL
LAVOLIN: 34.3 mL/m2
LDCA: 3.8 cm2
LEFT ATRIUM END SYS DIAM: 41 mm
LV E/e' medial: 7.53
LV PW d: 13.5 mm — AB (ref 0.6–1.1)
LV TDI E'MEDIAL: 7.18
LVEEAVG: 7.53
LVELAT: 8.38 cm/s
LVOT diameter: 22 mm
Lateral S' vel: 19.6 cm/s
MV pk A vel: 54.3 m/s
MV pk E vel: 63.1 m/s
TDI e' lateral: 8.38
WEIGHTICAEL: 3640 [oz_av]

## 2016-09-10 LAB — HEPATIC FUNCTION PANEL
ALBUMIN: 3.5 g/dL (ref 3.5–5.0)
ALT: 14 U/L — AB (ref 17–63)
AST: 16 U/L (ref 15–41)
Alkaline Phosphatase: 53 U/L (ref 38–126)
BILIRUBIN DIRECT: 0.2 mg/dL (ref 0.1–0.5)
BILIRUBIN INDIRECT: 1 mg/dL — AB (ref 0.3–0.9)
TOTAL PROTEIN: 6.1 g/dL — AB (ref 6.5–8.1)
Total Bilirubin: 1.2 mg/dL (ref 0.3–1.2)

## 2016-09-10 MED ORDER — ACETAMINOPHEN 325 MG PO TABS: 650.0000 mg | ORAL_TABLET | ORAL | Status: DC | PRN

## 2016-09-10 MED ORDER — ASPIRIN 300 MG RE SUPP: 300.0000 mg | Freq: Every day | RECTAL | Status: DC

## 2016-09-10 MED ORDER — HYDRALAZINE HCL 20 MG/ML IJ SOLN: 10.0000 mg | Freq: Four times a day (QID) | INTRAMUSCULAR | Status: DC | PRN

## 2016-09-10 MED ORDER — RIVAROXABAN 20 MG PO TABS: 20.0000 mg | ORAL_TABLET | Freq: Every day | ORAL | 0 refills | Status: DC

## 2016-09-10 MED ORDER — ATORVASTATIN CALCIUM 40 MG PO TABS: 40.0000 mg | ORAL_TABLET | Freq: Every day | ORAL | 0 refills | Status: AC

## 2016-09-10 MED ORDER — ASPIRIN 325 MG PO TABS
325.0000 mg | ORAL_TABLET | Freq: Every day | ORAL | Status: DC
  Administered 2016-09-10: 325 mg via ORAL
  Filled 2016-09-10: qty 1

## 2016-09-10 MED ORDER — ACETAMINOPHEN 650 MG RE SUPP: 650.0000 mg | RECTAL | Status: DC | PRN

## 2016-09-10 MED ORDER — FLECAINIDE ACETATE 100 MG PO TABS
100.0000 mg | ORAL_TABLET | Freq: Two times a day (BID) | ORAL | Status: DC
  Administered 2016-09-10: 100 mg via ORAL
  Filled 2016-09-10 (×2): qty 1

## 2016-09-10 MED ORDER — RIVAROXABAN 20 MG PO TABS: 20.0000 mg | ORAL_TABLET | Freq: Every day | ORAL | 0 refills | Status: AC

## 2016-09-10 MED ORDER — ATORVASTATIN CALCIUM 40 MG PO TABS
40.0000 mg | ORAL_TABLET | Freq: Every day | ORAL | Status: DC
  Administered 2016-09-10: 40 mg via ORAL
  Filled 2016-09-10: qty 1

## 2016-09-10 MED ORDER — RIVAROXABAN 20 MG PO TABS
20.0000 mg | ORAL_TABLET | Freq: Every day | ORAL | Status: DC
  Administered 2016-09-10: 20 mg via ORAL
  Filled 2016-09-10: qty 1

## 2016-09-10 MED ORDER — LISINOPRIL 40 MG PO TABS: 20.0000 mg | ORAL_TABLET | Freq: Every day | ORAL | Status: AC

## 2016-09-10 MED ORDER — STROKE: EARLY STAGES OF RECOVERY BOOK
Freq: Once | Status: AC
  Administered 2016-09-10: 03:00:00
  Filled 2016-09-10: qty 1

## 2016-09-10 MED ORDER — HEPARIN SODIUM (PORCINE) 5000 UNIT/ML IJ SOLN: 5000.0000 [IU] | Freq: Three times a day (TID) | INTRAMUSCULAR | Status: DC

## 2016-09-10 MED ORDER — LISINOPRIL 20 MG PO TABS
20.0000 mg | ORAL_TABLET | Freq: Every day | ORAL | Status: DC
  Filled 2016-09-10: qty 1

## 2016-09-10 NOTE — Discharge Summary (Addendum)
Tyler Ruiz ZOX:[id number] DOB: Nov 11, 1946 DOA: 09/09/2016  PCP: Everlean Cherry, MD  Admit date: 09/09/2016  Discharge date: 09/10/2016  Admitted From: Home   Disposition:  Home   Recommendations for Outpatient Follow-up:   Follow up with PCP in 1-2 weeks  PCP Please obtain BMP/CBC, 2 view CXR in 1week,  (see Discharge instructions)   PCP Please follow up on the following pending results: A1c   Home Health: None   Equipment/Devices: None  Consultations: Cards, neuro Discharge Condition: Stable   CODE STATUS: Full   Diet Recommendation: Heart Healthy    Chief Complaint  Patient presents with  . Numbness     Brief history of present illness from the day of admission and additional interim summary     Tyler Ruiz a 70 y.o.gentleman with a history of paroxysmal atrial fibrillation (S/P cardioversion x 3, previously anticoagulated with Xarelto for about six months, NOT on chronic anticoagulation at this time) and HTN who presents to the ED at [facility] for evaluation of right upper extremity numbness. The patient describes the acute onset of symptoms at [facility] on Monday morning. No associated headache, vision disturbance, or actual weakness. When symptoms persisted for several hours, he called his cardiologist. Options for how to proceed with further evaluation were discussed. The patient ultimately decided to schedule a follow-up appointment for today. His RUE numbness persisted through the evening. He did not take any additional medications besides his regular home medications, which include baby aspirin, in an attempt to alleviate his symptoms. Symptoms had resolved when he awakened this morning, but they returned while he was attempting to do yard work. His wife also noticed slurred  speech (but no asymmetrical facial drooping), and the patient admits that he "generally was not feeling well". This prompted presentation to the ED instead of waiting for his outpatient appointment.  He reports that his symptoms resolved around 5PM this evening. He denies ever having significant hand or arm weakness. No RLE deficits. No headache. No palpitations.  Hospital issues addressed     1.Acute thromboembolic CVA. Likely due to paroxysmal atrial fibrillation, discussed his case with his primary cardiologist Dr. Sampson Goon at [facility], at this time no contraindication to correlation. He was seen by stroke team and placed on xaralto, vascular ultrasound unremarkable, echocardiogram stable, LDL above 70 so placed on Lipitor, A1c pending, he is now at his baseline or deficits have dissolved have discussed his case with Dr. Pearlean Brownie neurologist who is cleared him for home discharge, no need for PT-OT a speech evaluation.  2. Paroxysmal atrial fibrillation. Currently on flecainide and in sinus, [place] vasc 2 score of at least 3 now which include stroke. Continue flecainide and xaralto follow with primary cardiologist.  3. Dyslipidemia. Placed on statin.  4.HTN - hold lisinopril for permissive hypertension for #1 above. Resume ACE inhibitor on 09/12/2016.     Discharge diagnosis     Principal Problem:   Acute CVA (cerebrovascular accident) (HCC) Active Problems:   HTN (hypertension)   Cervical disc disease  Atrial fibrillation [facility])    Discharge instructions    Discharge Instructions    Diet - low sodium heart healthy    Complete by:  As directed    Discharge instructions    Complete by:  As directed    Follow with Primary MD Everlean Cherry, MD in 7 days , get your A1c, lipid panel and blood pressure monitored closely   Get CBC, CMP, 2 view Chest X ray checked  by Primary MD or SNF MD in 5-7 days ( we routinely change or add medications that can affect your baseline  labs and fluid status, therefore we recommend that you get the mentioned basic workup next visit with your PCP, your PCP may decide not to get them or add new tests based on their clinical decision)   Activity: As tolerated with Full fall precautions use walker/cane & assistance as needed   Disposition Home     Diet:   Heart Healthy  .  For Heart failure patients - Check your Weight same time everyday, if you gain over 2 pounds, or you develop in leg swelling, experience more shortness of breath or chest pain, call your Primary MD immediately. Follow Cardiac Low Salt Diet and 1.5 lit/day fluid restriction.   On your next visit with your primary care physician please Get Medicines reviewed and adjusted.   Please request your Prim.MD to go over all Hospital Tests and Procedure/Radiological results at the follow up, please get all Hospital records sent to your Prim MD by signing hospital release before you go home.   If you experience worsening of your admission symptoms, develop shortness of breath, life threatening emergency, suicidal or homicidal thoughts you must seek medical attention immediately by calling 911 or calling your MD immediately  if symptoms less severe.  You Must read complete instructions/literature along with all the possible adverse reactions/side effects for all the Medicines you take and that have been prescribed to you. Take any new Medicines after you have completely understood and accpet all the possible adverse reactions/side effects.   Do not drive, operate heavy machinery, perform activities at heights, swimming or participation in water activities or provide baby sitting services if your were admitted for syncope or siezures until you have seen by Primary MD or a Neurologist and advised to do so again.  Do not drive when taking Pain medications.    Do not take more than prescribed Pain, Sleep and Anxiety Medications  Special Instructions: If you have smoked  or chewed Tobacco  in the last 2 yrs please stop smoking, stop any regular Alcohol  and or any Recreational drug use.  Wear Seat belts while driving.   Please note  You were cared for by a hospitalist during your hospital stay. If you have any questions about your discharge medications or the care you received while you were in the hospital after you are discharged, you can call the unit and asked to speak with the hospitalist on call if the hospitalist that took care of you is not available. Once you are discharged, your primary care physician will handle any further medical issues. Please note that NO REFILLS for any discharge medications will be authorized once you are discharged, as it is imperative that you return to your primary care physician (or establish a relationship with a primary care physician if you do not have one) for your aftercare needs so that they can reassess your need for medications and monitor your lab values.  Increase activity slowly    Complete by:  As directed       Discharge Medications     Medication List    STOP taking these medications   aspirin EC 81 MG tablet     TAKE these medications   atorvastatin 40 MG tablet Commonly known as:  LIPITOR Take 1 tablet (40 mg total) by mouth daily at 6 PM.   cholecalciferol 1000 units tablet Commonly known as:  VITAMIN D Take 1,000 Units by mouth every other day.   docusate sodium 100 MG capsule Commonly known as:  COLACE Take 100-200 mg by mouth daily as needed for mild constipation.   Fish Oil 1000 MG Caps Take 1,000 mg by mouth at bedtime.   flecainide 100 MG tablet Commonly known as:  TAMBOCOR Take 100 mg by mouth 2 (two) times daily.   lisinopril 40 MG tablet Commonly known as:  PRINIVIL,ZESTRIL Take 0.5 tablets (20 mg total) by mouth daily. Start taking on:  09/12/2016   multivitamin with minerals Tabs tablet Take 1 tablet by mouth daily.   rivaroxaban 20 MG Tabs tablet Commonly known as:   XARELTO Take 1 tablet (20 mg total) by mouth daily with supper.       Follow-up Information    SETHI,PRAMOD, MD. Schedule an appointment as soon as possible for a visit in 1 month(s).   Specialties:  Neurology, Radiology Contact information: [address] [place] [address] [phone]        Everlean Cherry, MD. Schedule an appointment as soon as possible for a visit in 1 week(s).   Specialty:  Family Medicine Contact information: [address] [place] [address] [phone]        Gayla Doss, MD. Schedule an appointment as soon as possible for a visit in 1 week(s).   Specialty:  Cardiology Contact information: [address] [place] [address] [phone]           Major procedures and Radiology Reports - PLEASE review detailed and final reports thoroughly  -       Vascular Ultrasound - Carotid Duplex (Doppler)has been completed. Preliminary findings: Bilateral: No significant (1-39%) ICA stenosis. Antegrade vertebral flow.   TTE  Left ventricle: The cavity size was normal. Wall thickness wasincreased in a pattern of mild LVH. Systolic function was normal.The estimated ejection fraction was in the range of 60% to 65%.Wall motion was normal; there were no regional wall motionabnormalities. Left ventricular diastolic function parameters were normal.   Ct Head Wo Contrast  Result Date: 09/09/2016 CLINICAL DATA:  Right arm numbness. EXAM: CT HEAD WITHOUT CONTRAST TECHNIQUE: Contiguous axial images were obtained from the base of the skull through the vertex without intravenous contrast. COMPARISON:  12/31/2015 FINDINGS: Brain: Small focus of cortical blurring in the posterior left frontal region, possible acute infarct. Elsewhere negative brain without hemorrhage, hydrocephalus, or mass lesion. Vascular: Extensive atherosclerosis with probable generalized ectasia. No hyperdense vessel sign. Skull: Normal.  Negative for fracture or focal lesion. Sinuses/Orbits: No acute finding. Other: None. IMPRESSION: Suspected small acute infarct in the posterior left frontal cortex. MRI is suggested. Electronically Signed   By: Marnee Spring M.D.   On: 09/09/2016 14:30   Mr Brain Wo Contrast  Result Date: 09/09/2016 CLINICAL DATA:  RIGHT hand numbness.  History of hypertension. EXAM: MRI HEAD WITHOUT CONTRAST MRA HEAD WITHOUT CONTRAST MRI CERVICAL SPINE WITHOUT CONTRAST TECHNIQUE: Multiplanar, multiecho pulse sequences of the brain and surrounding structures  were obtained without intravenous contrast. Angiographic images of the Circle of Willis were obtained using MRA technique without intravenous contrast. Multiplanar, multi echo pulse sequences of the cervical spine were obtained without contrast. COMPARISON:  CT HEAD September 09, 2016 1420 hours FINDINGS: MRI HEAD FINDINGS BRAIN: Multiple sub cm foci of reduced diffusion in LEFT frontal and parietal cortices, LEFT periatrial white matter, and LEFT occipital lobe. Corresponding low ADC values in the larger foci. No susceptibility artifact to suggest hemorrhage. No susceptibility artifact to suggest hemorrhage. The ventricles and sulci are normal for patient's age. Patchy supratentorial white matter FLAIR T2 hyperintensities exclusive of the aforementioned abnormality compatible with mild chronic small vessel ischemic disease. No suspicious parenchymal signal, masses or mass effect. No abnormal extra-axial fluid collections. No extra-axial masses though, contrast enhanced sequences would be more sensitive. VASCULAR: Major dolicoectatic intracranial vascular flow voids present at skull base. SKULL AND UPPER CERVICAL SPINE: No abnormal sellar expansion. No suspicious calvarial bone marrow signal. Craniocervical junction maintained. SINUSES/ORBITS: Small RIGHT maxillary mucosal retention cyst. Mastoid air cells are well aerated. The included ocular globes and orbital contents  are non-suspicious. OTHER: None. MRA HEAD FINDINGS ANTERIOR CIRCULATION: Dolicoectatic flow related enhancement of the included cervical, petrous, cavernous and supraclinoid internal carotid arteries. LEFT A1 segment developmentally dominant. Patent anterior communicating artery. Moderate to severe stenosis distal RIGHT M2 segment. Occluded LEFT M2 superior segment. Normal flow related enhancement of the anterior cerebral arteries, including distal segments. Mild luminal regularity. No aneurysm. POSTERIOR CIRCULATION: LEFT vertebral artery is dominant. Basilar artery is patent, with normal flow related enhancement of the main branch vessels. Robust RIGHT posterior communicating artery present. Flow related enhancement of the posterior cerebral arteries. Mild focal stenosis RIGHT P3 segment. Mild luminal irregularity. No large vessel occlusion, high-grade stenosis, aneurysm. MRI CERVICAL SPINE FINDINGS ALIGNMENT: Straightened cervical lordosis. Minimal grade 1 C4-5 anterolisthesis. VERTEBRAE/DISCS: Vertebral bodies are intact. Moderate C3-4 disc height loss, mild at C5-6. Decreased T2 signal within all disc compatible with desiccation. Moderate chronic discogenic endplate changes C5-6, mild at C3-4, C4-5 and C6-7. Bright STIR signal LEFT C6-7 facet. CORD:Cervical spinal cord is normal morphology and signal characteristics from the cervicomedullary junction to level of T1-2, the most caudal well visualized level. POSTERIOR FOSSA, VERTEBRAL ARTERIES, PARASPINAL TISSUES: No MR findings of ligamentous injury. Trace bright STIR signal about the LEFT C6-7 facet. Vertebral artery flow voids present. Included posterior fossa and paraspinal soft tissues are normal. DISC LEVELS: C2-3: Small broad-based central disc protrusion. Uncovertebral hypertrophy. Mild RIGHT facet arthropathy. No canal stenosis or neural foraminal narrowing. C3-4: Small broad-based disc bulge, uncovertebral hypertrophy. Moderate RIGHT, mild LEFT facet  arthropathy. No canal stenosis. Severe RIGHT, moderate to severe LEFT neural foraminal narrowing. C4-5: Anterolisthesis. Annular bulging uncovertebral hypertrophy. Severe RIGHT, mild LEFT facet arthropathy. No canal stenosis. Moderate RIGHT neural foraminal narrowing. C5-6: Small broad-based disc bulge, uncovertebral hypertrophy. Moderate facet arthropathy. No canal stenosis. Moderate RIGHT, severe LEFT neural foraminal narrowing. C6-7: Annular bulging, uncovertebral hypertrophy. Moderate bilateral facet arthropathy. No canal stenosis. Mild RIGHT neural foraminal narrowing. C7-T1: Small RIGHT central disc protrusion. Uncovertebral hypertrophy. Severe LEFT facet arthropathy, mild on the RIGHT. LEFT facet effusion is likely reactive. No canal stenosis or neural foraminal narrowing. 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 Mild chronic small vessel ischemic disease. MRA HEAD: Acute LEFT M2 segment occlusion. High-grade stenosis distal LEFT M1 segment suggesting thromboembolism. MRI CERVICAL SPINE: Straightened cervical lordosis, grade 1 C4-5 anterolisthesis on degenerative basis.  LEFT C7-T1 facet edema and effusion, likely reactive. Degenerative cervical spine without canal stenosis. Multilevel neural foraminal narrowing: Severe on the RIGHT at C3-4, severe on the LEFT at C5-6. Acute findings discussed with and reconfirmed by Dr.Knott on 09/09/2016 at 6:52 pm. Electronically Signed   By: Awilda Metro M.D.   On: 09/09/2016 18:53   Mr Cervical Spine Wo Contrast  Result Date: 09/09/2016 CLINICAL DATA:  RIGHT hand numbness.  History of hypertension. EXAM: MRI HEAD WITHOUT CONTRAST MRA HEAD WITHOUT CONTRAST MRI CERVICAL SPINE WITHOUT CONTRAST TECHNIQUE: Multiplanar, multiecho pulse sequences of the brain and surrounding structures were obtained without intravenous contrast. Angiographic images of the Circle of Willis were obtained  using MRA technique without intravenous contrast. Multiplanar, multi echo pulse sequences of the cervical spine were obtained without contrast. COMPARISON:  CT HEAD September 09, 2016 1420 hours FINDINGS: MRI HEAD FINDINGS BRAIN: Multiple sub cm foci of reduced diffusion in LEFT frontal and parietal cortices, LEFT periatrial white matter, and LEFT occipital lobe. Corresponding low ADC values in the larger foci. No susceptibility artifact to suggest hemorrhage. No susceptibility artifact to suggest hemorrhage. The ventricles and sulci are normal for patient's age. Patchy supratentorial white matter FLAIR T2 hyperintensities exclusive of the aforementioned abnormality compatible with mild chronic small vessel ischemic disease. No suspicious parenchymal signal, masses or mass effect. No abnormal extra-axial fluid collections. No extra-axial masses though, contrast enhanced sequences would be more sensitive. VASCULAR: Major dolicoectatic intracranial vascular flow voids present at skull base. SKULL AND UPPER CERVICAL SPINE: No abnormal sellar expansion. No suspicious calvarial bone marrow signal. Craniocervical junction maintained. SINUSES/ORBITS: Small RIGHT maxillary mucosal retention cyst. Mastoid air cells are well aerated. The included ocular globes and orbital contents are non-suspicious. OTHER: None. MRA HEAD FINDINGS ANTERIOR CIRCULATION: Dolicoectatic flow related enhancement of the included cervical, petrous, cavernous and supraclinoid internal carotid arteries. LEFT A1 segment developmentally dominant. Patent anterior communicating artery. Moderate to severe stenosis distal RIGHT M2 segment. Occluded LEFT M2 superior segment. Normal flow related enhancement of the anterior cerebral arteries, including distal segments. Mild luminal regularity. No aneurysm. POSTERIOR CIRCULATION: LEFT vertebral artery is dominant. Basilar artery is patent, with normal flow related enhancement of the main branch vessels. Robust  RIGHT posterior communicating artery present. Flow related enhancement of the posterior cerebral arteries. Mild focal stenosis RIGHT P3 segment. Mild luminal irregularity. No large vessel occlusion, high-grade stenosis, aneurysm. MRI CERVICAL SPINE FINDINGS ALIGNMENT: Straightened cervical lordosis. Minimal grade 1 C4-5 anterolisthesis. VERTEBRAE/DISCS: Vertebral bodies are intact. Moderate C3-4 disc height loss, mild at C5-6. Decreased T2 signal within all disc compatible with desiccation. Moderate chronic discogenic endplate changes C5-6, mild at C3-4, C4-5 and C6-7. Bright STIR signal LEFT C6-7 facet. CORD:Cervical spinal cord is normal morphology and signal characteristics from the cervicomedullary junction to level of T1-2, the most caudal well visualized level. POSTERIOR FOSSA, VERTEBRAL ARTERIES, PARASPINAL TISSUES: No MR findings of ligamentous injury. Trace bright STIR signal about the LEFT C6-7 facet. Vertebral artery flow voids present. Included posterior fossa and paraspinal soft tissues are normal. DISC LEVELS: C2-3: Small broad-based central disc protrusion. Uncovertebral hypertrophy. Mild RIGHT facet arthropathy. No canal stenosis or neural foraminal narrowing. C3-4: Small broad-based disc bulge, uncovertebral hypertrophy. Moderate RIGHT, mild LEFT facet arthropathy. No canal stenosis. Severe RIGHT, moderate to severe LEFT neural foraminal narrowing. C4-5: Anterolisthesis. Annular bulging uncovertebral hypertrophy. Severe RIGHT, mild LEFT facet arthropathy. No canal stenosis. Moderate RIGHT neural foraminal narrowing. C5-6: Small broad-based disc bulge, uncovertebral hypertrophy. Moderate facet arthropathy. No canal stenosis. Moderate  RIGHT, severe LEFT neural foraminal narrowing. C6-7: Annular bulging, uncovertebral hypertrophy. Moderate bilateral facet arthropathy. No canal stenosis. Mild RIGHT neural foraminal narrowing. C7-T1: Small RIGHT central disc protrusion. Uncovertebral hypertrophy. Severe  LEFT facet arthropathy, mild on the RIGHT. LEFT facet effusion is likely reactive. No canal stenosis or neural foraminal narrowing. 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 Mild chronic small vessel ischemic disease. MRA HEAD: Acute LEFT M2 segment occlusion. High-grade stenosis distal LEFT M1 segment suggesting thromboembolism. MRI CERVICAL SPINE: Straightened cervical lordosis, grade 1 C4-5 anterolisthesis on degenerative basis. LEFT C7-T1 facet edema and effusion, likely reactive. Degenerative cervical spine without canal stenosis. Multilevel neural foraminal narrowing: Severe on the RIGHT at C3-4, severe on the LEFT at C5-6. Acute findings discussed with and reconfirmed by Dr.Knott on 09/09/2016 at 6:52 pm. Electronically Signed   By: Awilda Metroourtnay  Bloomer M.D.   On: 09/09/2016 18:53   Mr Maxine GlennMra Head/brain ZOWo Cm  Result Date: 09/09/2016 CLINICAL DATA:  RIGHT hand numbness.  History of hypertension. EXAM: MRI HEAD WITHOUT CONTRAST MRA HEAD WITHOUT CONTRAST MRI CERVICAL SPINE WITHOUT CONTRAST TECHNIQUE: Multiplanar, multiecho pulse sequences of the brain and surrounding structures were obtained without intravenous contrast. Angiographic images of the Circle of Willis were obtained using MRA technique without intravenous contrast. Multiplanar, multi echo pulse sequences of the cervical spine were obtained without contrast. COMPARISON:  CT HEAD September 09, 2016 1420 hours FINDINGS: MRI HEAD FINDINGS BRAIN: Multiple sub cm foci of reduced diffusion in LEFT frontal and parietal cortices, LEFT periatrial white matter, and LEFT occipital lobe. Corresponding low ADC values in the larger foci. No susceptibility artifact to suggest hemorrhage. No susceptibility artifact to suggest hemorrhage. The ventricles and sulci are normal for patient's age. Patchy supratentorial white matter FLAIR T2 hyperintensities exclusive of the aforementioned  abnormality compatible with mild chronic small vessel ischemic disease. No suspicious parenchymal signal, masses or mass effect. No abnormal extra-axial fluid collections. No extra-axial masses though, contrast enhanced sequences would be more sensitive. VASCULAR: Major dolicoectatic intracranial vascular flow voids present at skull base. SKULL AND UPPER CERVICAL SPINE: No abnormal sellar expansion. No suspicious calvarial bone marrow signal. Craniocervical junction maintained. SINUSES/ORBITS: Small RIGHT maxillary mucosal retention cyst. Mastoid air cells are well aerated. The included ocular globes and orbital contents are non-suspicious. OTHER: None. MRA HEAD FINDINGS ANTERIOR CIRCULATION: Dolicoectatic flow related enhancement of the included cervical, petrous, cavernous and supraclinoid internal carotid arteries. LEFT A1 segment developmentally dominant. Patent anterior communicating artery. Moderate to severe stenosis distal RIGHT M2 segment. Occluded LEFT M2 superior segment. Normal flow related enhancement of the anterior cerebral arteries, including distal segments. Mild luminal regularity. No aneurysm. POSTERIOR CIRCULATION: LEFT vertebral artery is dominant. Basilar artery is patent, with normal flow related enhancement of the main branch vessels. Robust RIGHT posterior communicating artery present. Flow related enhancement of the posterior cerebral arteries. Mild focal stenosis RIGHT P3 segment. Mild luminal irregularity. No large vessel occlusion, high-grade stenosis, aneurysm. MRI CERVICAL SPINE FINDINGS ALIGNMENT: Straightened cervical lordosis. Minimal grade 1 C4-5 anterolisthesis. VERTEBRAE/DISCS: Vertebral bodies are intact. Moderate C3-4 disc height loss, mild at C5-6. Decreased T2 signal within all disc compatible with desiccation. Moderate chronic discogenic endplate changes C5-6, mild at C3-4, C4-5 and C6-7. Bright STIR signal LEFT C6-7 facet. CORD:Cervical spinal cord is normal morphology and  signal characteristics from the cervicomedullary junction to level of T1-2, the most caudal well visualized level. POSTERIOR FOSSA, VERTEBRAL ARTERIES, PARASPINAL TISSUES: No MR findings of ligamentous  injury. Trace bright STIR signal about the LEFT C6-7 facet. Vertebral artery flow voids present. Included posterior fossa and paraspinal soft tissues are normal. DISC LEVELS: C2-3: Small broad-based central disc protrusion. Uncovertebral hypertrophy. Mild RIGHT facet arthropathy. No canal stenosis or neural foraminal narrowing. C3-4: Small broad-based disc bulge, uncovertebral hypertrophy. Moderate RIGHT, mild LEFT facet arthropathy. No canal stenosis. Severe RIGHT, moderate to severe LEFT neural foraminal narrowing. C4-5: Anterolisthesis. Annular bulging uncovertebral hypertrophy. Severe RIGHT, mild LEFT facet arthropathy. No canal stenosis. Moderate RIGHT neural foraminal narrowing. C5-6: Small broad-based disc bulge, uncovertebral hypertrophy. Moderate facet arthropathy. No canal stenosis. Moderate RIGHT, severe LEFT neural foraminal narrowing. C6-7: Annular bulging, uncovertebral hypertrophy. Moderate bilateral facet arthropathy. No canal stenosis. Mild RIGHT neural foraminal narrowing. C7-T1: Small RIGHT central disc protrusion. Uncovertebral hypertrophy. Severe LEFT facet arthropathy, mild on the RIGHT. LEFT facet effusion is likely reactive. No canal stenosis or neural foraminal narrowing. 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 Mild chronic small vessel ischemic disease. MRA HEAD: Acute LEFT M2 segment occlusion. High-grade stenosis distal LEFT M1 segment suggesting thromboembolism. MRI CERVICAL SPINE: Straightened cervical lordosis, grade 1 C4-5 anterolisthesis on degenerative basis. LEFT C7-T1 facet edema and effusion, likely reactive. Degenerative cervical spine without canal stenosis. Multilevel neural foraminal narrowing:  Severe on the RIGHT at C3-4, severe on the LEFT at C5-6. Acute findings discussed with and reconfirmed by Dr.Knott on 09/09/2016 at 6:52 pm. Electronically Signed   By: Awilda Metro M.D.   On: 09/09/2016 18:53    Micro Results   No results found for this or any previous visit (from the past 240 hour(s)).  Today   Subjective    Tyler Ruiz today has no headache,no chest abdominal pain,no new weakness tingling or numbness, feels much better wants to go home today.     Objective   Blood pressure (!) 140/91, pulse 68, temperature 98.4 F (36.9 C), temperature source Axillary, resp. rate 19, height 6\' 3"  (1.905 m), weight 103.2 kg (227 lb 8 oz), SpO2 96 %.   Intake/Output Summary (Last 24 hours) at 09/10/16 1146 Last data filed at 09/10/16 0843  Gross per 24 hour  Intake              600 ml  Output                0 ml  Net              600 ml    Exam Awake Alert, Oriented x 3, No new F.N deficits, Normal affect .AT,PERRAL Supple Neck,No JVD, No cervical lymphadenopathy appriciated.  Symmetrical Chest wall movement, Good air movement bilaterally, CTAB RRR,No Gallops,Rubs or new Murmurs, No Parasternal Heave +ve B.Sounds, Abd Soft, Non tender, No organomegaly appriciated, No rebound -guarding or rigidity. No Cyanosis, Clubbing or edema, No new Rash or bruise   Data Review   CBC w Diff:  Lab Results  Component Value Date   WBC 7.3 09/09/2016   HGB 15.0 09/09/2016   HCT 43.6 09/09/2016   PLT 190 09/09/2016   LYMPHOPCT 8 (L) 03/08/2012   MONOPCT 5 03/08/2012   EOSPCT 1 03/08/2012   BASOPCT 0 03/08/2012    CMP:  Lab Results  Component Value Date   NA 140 09/09/2016   K 4.7 09/09/2016   CL 105 09/09/2016   CO2 29 09/09/2016   BUN 16 09/09/2016   CREATININE 1.22 09/09/2016   PROT 6.1 (L) 09/10/2016  ALBUMIN 3.5 09/10/2016   BILITOT 1.2 09/10/2016   ALKPHOS 53 09/10/2016   AST 16 09/10/2016   ALT 14 (L) 09/10/2016  . Lab Results  Component Value Date     CHOL 208 (H) 09/10/2016   HDL 40 (L) 09/10/2016   LDLCALC 149 (H) 09/10/2016   TRIG 97 09/10/2016   CHOLHDL 5.2 09/10/2016     Total Time in preparing paper work, data evaluation and todays exam - 35 minutes  Leroy Sea M.D on 09/10/2016 at 11:46 AM  [facility]   Office  [phone]

## 2016-09-10 NOTE — Evaluation (Signed)
Occupational Therapy Evaluation Patient Details Name: Tyler PintoStephen Ruiz MRN: [id number] DOB: Aug 28, 1946 Today's Date: 09/10/2016    History of Present Illness HPI: Tyler Ruiz is a 70 y.o. gentleman with a history of paroxysmal atrial fibrillation (S/P cardioversion x 3, previously anticoagulated with Xarelto for about six months, NOT on chronic anticoagulation at this time) and HTN who [facility] for evaluation of right upper extremity numbness. Head CT actually suggested small acute infarct of the posterior left frontal cortex.   Clinical Impression   Pt is at baseline independent level of function with ADLs and mobility. Pt's weakness, numbness and decreased coordination in R UE has resolved and pt with no vision changes or balance deficits. All education completed and no further acute OT indicated   Follow Up Recommendations  No OT follow up    Equipment Recommendations  None recommended by OT    Recommendations for Other Services       Precautions / Restrictions        Mobility Bed Mobility Overal bed mobility: Independent                Transfers Overall transfer level: Independent Equipment used: None                  Balance Overall balance assessment: No apparent balance deficits (not formally assessed)                                          ADL Overall ADL's : At baseline;Independent                                             Vision  wears glasses, no change from baseline              Pertinent Vitals/Pain Pain Assessment: No/denies pain     Hand Dominance Right   Extremity/Trunk Assessment Upper Extremity Assessment Upper Extremity Assessment: Overall WFL for tasks assessed   Lower Extremity Assessment Lower Extremity Assessment: Defer to PT evaluation       Communication Communication Communication: No difficulties   Cognition Arousal/Alertness:  Awake/alert Behavior During Therapy: WFL for tasks assessed/performed Overall Cognitive Status: Within Functional Limits for tasks assessed                     General Comments   pt very pleasant and cooperative, ready to go home                 Home Living Family/patient expects to be discharged to:: Private residence Living Arrangements: Spouse/significant other Available Help at Discharge: Family Type of Home: House Home Access: Stairs to enter [employer] Corporation[employer]trance Stairs-Number of Steps: 2   Home Layout: One level;Other (Comment) (office is above the garage, 15 steps with railing on R side)     Bathroom Shower/Tub: Tub/shower unit;Walk-in shower   Bathroom Toilet: Standard     Home Equipment: None          Prior Functioning/Environment Level of Independence: Independent                 OT Problem List: Decreased coordination;Impaired UE functional use;Decreased strength         OT Goals(Current goals can be found in the care  plan section) Acute Rehab OT Goals Patient Stated Goal: go home  OT Frequency:     Barriers to D/C:  no barriers                        End of Session Nurse Communication: Mobility status  Activity Tolerance: Patient tolerated treatment well Patient left: in bed;with family/visitor present;with call bell/phone within reach   Time: 1147-1203 OT Time Calculation (min): 16 min Charges:  OT General Charges $OT Visit: 1 Procedure OT Evaluation $OT Eval Moderate Complexity: 1 Procedure G-Codes: OT G-codes **NOT FOR INPATIENT CLASS** Functional Assessment Tool Used: clinical judgement Functional Limitation: Self care Self Care Current Status (Z6109): 0 percent impaired, limited or restricted Self Care Goal Status (U0454): 0 percent impaired, limited or restricted Self Care Discharge Status (U9811): 0 percent impaired, limited or restricted  Galen Manila 09/10/2016, 1:38 PM

## 2016-09-10 NOTE — Discharge Instructions (Signed)
Follow with Primary MD Everlean CherryWHYTE,THOMAS M, MD in 7 days , get your A1c, lipid panel and blood pressure monitored closely   Get CBC, CMP, 2 view Chest X ray checked  by Primary MD or SNF MD in 5-7 days ( we routinely change or add medications that can affect your baseline labs and fluid status, therefore we recommend that you get the mentioned basic workup next visit with your PCP, your PCP may decide not to get them or add new tests based on their clinical decision)   Activity: As tolerated with Full fall precautions use walker/cane & assistance as needed   Disposition Home     Diet:   Heart Healthy  .  For Heart failure patients - Check your Weight same time everyday, if you gain over 2 pounds, or you develop in leg swelling, experience more shortness of breath or chest pain, call your Primary MD immediately. Follow Cardiac Low Salt Diet and 1.5 lit/day fluid restriction.   On your next visit with your primary care physician please Get Medicines reviewed and adjusted.   Please request your Prim.MD to go over all Hospital Tests and Procedure/Radiological results at the follow up, please get all Hospital records sent to your Prim MD by signing hospital release before you go home.   If you experience worsening of your admission symptoms, develop shortness of breath, life threatening emergency, suicidal or homicidal thoughts you must seek medical attention immediately by calling 911 or calling your MD immediately  if symptoms less severe.  You Must read complete instructions/literature along with all the possible adverse reactions/side effects for all the Medicines you take and that have been prescribed to you. Take any new Medicines after you have completely understood and accpet all the possible adverse reactions/side effects.   Do not drive, operate heavy machinery, perform activities at heights, swimming or participation in water activities or provide baby sitting services if your were  admitted for syncope or siezures until you have seen by Primary MD or a Neurologist and advised to do so again.  Do not drive when taking Pain medications.    Do not take more than prescribed Pain, Sleep and Anxiety Medications  Special Instructions: If you have smoked or chewed Tobacco  in the last 2 yrs please stop smoking, stop any regular Alcohol  and or any Recreational drug use.  Wear Seat belts while driving.   Please note  You were cared for by a hospitalist during your hospital stay. If you have any questions about your discharge medications or the care you received while you were in the hospital after you are discharged, you can call the unit and asked to speak with the hospitalist on call if the hospitalist that took care of you is not available. Once you are discharged, your primary care physician will handle any further medical issues. Please note that NO REFILLS for any discharge medications will be authorized once you are discharged, as it is imperative that you return to your primary care physician (or establish a relationship with a primary care physician if you do not have one) for your aftercare needs so that they can reassess your need for medications and monitor your lab values.   Paresthesia Paresthesia is an abnormal burning or prickling sensation. This sensation is generally felt in the hands, arms, legs, or feet. However, it may occur in any part of the body. Usually, it is not painful. The feeling may be described as:  Tingling or numbness.  Pins and needles.  Skin crawling.  Buzzing.  Limbs falling asleep.  Itching. Most people experience temporary (transient) paresthesia at some time in their lives. Paresthesia may occur when you breathe too quickly (hyperventilation). It can also occur without any apparent cause. Commonly, paresthesia occurs when pressure is placed on a nerve. The sensation quickly goes away after the pressure is removed. For some people,  however, paresthesia is a long-lasting (chronic) condition that is caused by an underlying disorder. If you continue to have paresthesia, you may need further medical evaluation. HOME CARE INSTRUCTIONS Watch your condition for any changes. Taking the following actions may help to lessen any discomfort that you are feeling:  Avoid drinking alcohol.  Try acupuncture or massage to help relieve your symptoms.  Keep all follow-up visits as directed by your health care provider. This is important. SEEK MEDICAL CARE IF:  You continue to have episodes of paresthesia.  Your burning or prickling feeling gets worse when you walk.  You have pain, cramps, or dizziness.  You develop a rash. SEEK IMMEDIATE MEDICAL CARE IF:  You feel weak.  You have trouble walking or moving.  You have problems with speech, understanding, or vision.  You feel confused.  You cannot control your bladder or bowel movements.  You have numbness after an injury.  You faint.   This information is not intended to replace advice given to you by your health care provider. Make sure you discuss any questions you have with your health care provider.   Document Released: 11/28/2002 Document Revised: 04/24/2015 Document Reviewed: 12/04/2014 [employer] Interactive Patient Education [employer]

## 2016-09-10 NOTE — Consult Note (Signed)
Admission H&P    Chief Complaint: Recurrent numbness of right hand.  HPI: Bow Buntyn is an 70 y.o. male with a history of atrial fibrillation and successful cardioversion and hypertension was evaluated in the ED and [facility] for recurrent episodes of numbness involving his right hand. First episode occurred at 9 AM on 09/08/2016, and subsequently resolved. He is a recurrent episode of similar symptoms on 09/09/2016 with associated slurred speech. Symptoms resolved in about one hour. CT scan of the head showed a suspected acute infarct in the history left frontal cortex. MRI showed multiple subcentimeter left MCA territory and MCA watershed infarcts consistent with thromboembolic embolism. MR angiogram of the head showed acute left him to segment occlusion, as well as high-grade distal left M1 stenosis. NIH stroke score at the time of this evaluation was 0.  LSN: 9 AM on 09/08/2016 tPA Given: No: Deficits resolved mRankin:  Past Medical History:  Diagnosis Date  . Atrial fibrillation ([place])   . Dysrhythmia approx 2014   afib  . Hypertension   . Night muscle spasms    lower back and flank area  . Stroke [facility])    newly dx. 09/09/16    Past Surgical History:  Procedure Laterality Date  . CARDIOVERSION     x 2  . dental implant x 1 tooth    . DENTAL SURGERY    . nondisplaced right shoulder fx    . SKIN CANCER EXCISION    . skin cancer removal     from back - no further treatment    Family History  Problem Relation Age of Onset  . Stroke Mother     in age 77's  . AAA (abdominal aortic aneurysm) Father    Social History:  reports that he has never smoked. He has never used smokeless tobacco. He reports that he drinks alcohol. He reports that he does not use drugs.  Allergies: No Known Allergies  Medications Prior to Admission  Medication Sig Dispense Refill  . aspirin EC 81 MG tablet Take 81 mg by mouth at bedtime.     . cholecalciferol (VITAMIN D) 1000 units  tablet Take 1,000 Units by mouth every other day.     . docusate sodium (COLACE) 100 MG capsule Take 100-200 mg by mouth daily as needed for mild constipation.    . flecainide (TAMBOCOR) 100 MG tablet Take 100 mg by mouth 2 (two) times daily.    Marland Kitchen lisinopril (PRINIVIL,ZESTRIL) 40 MG tablet Take 20 mg by mouth daily.     . Multiple Vitamin (MULTIVITAMIN WITH MINERALS) TABS tablet Take 1 tablet by mouth daily.    . Omega-3 Fatty Acids (FISH OIL) 1000 MG CAPS Take 1,000 mg by mouth at bedtime.       ROS: History obtained from the patient  General ROS: negative for - chills, fatigue, fever, night sweats, weight gain or weight loss Psychological ROS: negative for - behavioral disorder, hallucinations, memory difficulties, mood swings or suicidal ideation Ophthalmic ROS: negative for - blurry vision, double vision, eye pain or loss of vision ENT ROS: negative for - epistaxis, nasal discharge, oral lesions, sore throat, tinnitus or vertigo Allergy and Immunology ROS: negative for - hives or itchy/watery eyes Hematological and Lymphatic ROS: negative for - bleeding problems, bruising or swollen lymph nodes Endocrine ROS: negative for - galactorrhea, hair pattern changes, polydipsia/polyuria or temperature intolerance Respiratory ROS: negative for - cough, hemoptysis, shortness of breath or wheezing Cardiovascular ROS: negative for - chest pain, dyspnea  on exertion, edema or irregular heartbeat Gastrointestinal ROS: negative for - abdominal pain, diarrhea, hematemesis, nausea/vomiting or stool incontinence Genito-Urinary ROS: negative for - dysuria, hematuria, incontinence or urinary frequency/urgency Musculoskeletal ROS: negative for - joint swelling or muscular weakness Neurological ROS: as noted in HPI Dermatological ROS: negative for rash and skin lesion changes  Physical Examination: Blood pressure (!) 152/99, pulse 67, temperature 99.3 F (37.4 C), temperature source Oral, resp. rate 18,  height 6' 3"  (1.905 m), weight 103.2 kg (227 lb 8 oz), SpO2 97 %.  HEENT-  Normocephalic, no lesions, without obvious abnormality.  Normal external eye and conjunctiva.  Normal TM's bilaterally.  Normal auditory canals and external ears. Normal external nose, mucus membranes and septum.  Normal pharynx. Neck supple with no masses, nodes, nodules or enlargement. Cardiovascular - regular rate and rhythm, S1, S2 normal, no murmur, click, rub or gallop Lungs - chest clear, no wheezing, rales, normal symmetric air entry Abdomen - soft, non-tender; bowel sounds normal; no masses,  no organomegaly Extremities - no joint deformities, effusion, or inflammation  Neurologic Examination: Mental Status: Alert, oriented, thought content appropriate.  Speech fluent without evidence of aphasia. Able to follow commands without difficulty. Cranial Nerves: II-Visual fields were normal. III/IV/VI-Pupils were equal and reacted normally to light. Extraocular movements were full and conjugate.    V/VII-no facial numbness and no facial weakness. VIII-normal. X-normal speech and symmetrical palatal movement. XI: trapezius strength/neck flexion strength normal bilaterally XII-midline tongue extension with normal strength. Motor: 5/5 bilaterally with normal tone and bulk Sensory: Normal throughout. Deep Tendon Reflexes: 1+ and symmetric. Plantars: Mute bilaterally Cerebellar: Normal finger-to-nose testing. Carotid auscultation: Normal  Results for orders placed or performed during the hospital encounter of 09/09/16 (from the past 48 hour(s))  Basic metabolic panel     Status: Abnormal   Collection Time: 09/09/16  2:10 PM  Result Value Ref Range   Sodium 140 135 - 145 mmol/L   Potassium 4.7 3.5 - 5.1 mmol/L   Chloride 105 101 - 111 mmol/L   CO2 29 22 - 32 mmol/L   Glucose, Bld 91 65 - 99 mg/dL   BUN 16 6 - 20 mg/dL   Creatinine, Ser 1.22 0.61 - 1.24 mg/dL   Calcium 9.4 8.9 - 10.3 mg/dL   GFR calc non Af  Amer 59 (L) >60 mL/min   GFR calc Af Amer >60 >60 mL/min    Comment: (NOTE) The eGFR has been calculated using the CKD EPI equation. This calculation has not been validated in all clinical situations. eGFR's persistently <60 mL/min signify possible Chronic Kidney Disease.    Anion gap 6 5 - 15  CBC     Status: None   Collection Time: 09/09/16  2:10 PM  Result Value Ref Range   WBC 7.3 4.0 - 10.5 K/uL   RBC 5.07 4.22 - 5.81 MIL/uL   Hemoglobin 15.0 13.0 - 17.0 g/dL   HCT 43.6 39.0 - 52.0 %   MCV 86.0 78.0 - 100.0 fL   MCH 29.6 26.0 - 34.0 pg   MCHC 34.4 30.0 - 36.0 g/dL   RDW 13.1 11.5 - 15.5 %   Platelets 190 150 - 400 K/uL  I-stat troponin, ED     Status: None   Collection Time: 09/09/16  2:11 PM  Result Value Ref Range   Troponin i, poc 0.00 0.00 - 0.08 ng/mL   Comment 3            Comment: Due to the release kinetics  of cTnI, a negative result within the first hours of the onset of symptoms does not rule out myocardial infarction with certainty. If myocardial infarction is still suspected, repeat the test at appropriate intervals.    Ct Head Wo Contrast  Result Date: 09/09/2016 CLINICAL DATA:  Right arm numbness. EXAM: CT HEAD WITHOUT CONTRAST TECHNIQUE: Contiguous axial images were obtained from the base of the skull through the vertex without intravenous contrast. COMPARISON:  12/31/2015 FINDINGS: Brain: Small focus of cortical blurring in the posterior left frontal region, possible acute infarct. Elsewhere negative brain without hemorrhage, hydrocephalus, or mass lesion. Vascular: Extensive atherosclerosis with probable generalized ectasia. No hyperdense vessel sign. Skull: Normal. Negative for fracture or focal lesion. Sinuses/Orbits: No acute finding. Other: None. IMPRESSION: Suspected small acute infarct in the posterior left frontal cortex. MRI is suggested. Electronically Signed   By: Monte Fantasia M.D.   On: 09/09/2016 14:30   Mr Brain Wo Contrast  Result Date:  09/09/2016 CLINICAL DATA:  RIGHT hand numbness.  History of hypertension. EXAM: MRI HEAD WITHOUT CONTRAST MRA HEAD WITHOUT CONTRAST MRI CERVICAL SPINE WITHOUT CONTRAST TECHNIQUE: Multiplanar, multiecho pulse sequences of the brain and surrounding structures were obtained without intravenous contrast. Angiographic images of the Circle of Willis were obtained using MRA technique without intravenous contrast. Multiplanar, multi echo pulse sequences of the cervical spine were obtained without contrast. COMPARISON:  CT HEAD September 09, 2016 1420 hours FINDINGS: MRI HEAD FINDINGS BRAIN: Multiple sub cm foci of reduced diffusion in LEFT frontal and parietal cortices, LEFT periatrial white matter, and LEFT occipital lobe. Corresponding low ADC values in the larger foci. No susceptibility artifact to suggest hemorrhage. No susceptibility artifact to suggest hemorrhage. The ventricles and sulci are normal for patient's age. Patchy supratentorial white matter FLAIR T2 hyperintensities exclusive of the aforementioned abnormality compatible with mild chronic small vessel ischemic disease. No suspicious parenchymal signal, masses or mass effect. No abnormal extra-axial fluid collections. No extra-axial masses though, contrast enhanced sequences would be more sensitive. VASCULAR: Major dolicoectatic intracranial vascular flow voids present at skull base. SKULL AND UPPER CERVICAL SPINE: No abnormal sellar expansion. No suspicious calvarial bone marrow signal. Craniocervical junction maintained. SINUSES/ORBITS: Small RIGHT maxillary mucosal retention cyst. Mastoid air cells are well aerated. The included ocular globes and orbital contents are non-suspicious. OTHER: None. MRA HEAD FINDINGS ANTERIOR CIRCULATION: Dolicoectatic flow related enhancement of the included cervical, petrous, cavernous and supraclinoid internal carotid arteries. LEFT A1 segment developmentally dominant. Patent anterior communicating artery. Moderate to  severe stenosis distal RIGHT M2 segment. Occluded LEFT M2 superior segment. Normal flow related enhancement of the anterior cerebral arteries, including distal segments. Mild luminal regularity. No aneurysm. POSTERIOR CIRCULATION: LEFT vertebral artery is dominant. Basilar artery is patent, with normal flow related enhancement of the main branch vessels. Robust RIGHT posterior communicating artery present. Flow related enhancement of the posterior cerebral arteries. Mild focal stenosis RIGHT P3 segment. Mild luminal irregularity. No large vessel occlusion, high-grade stenosis, aneurysm. MRI CERVICAL SPINE FINDINGS ALIGNMENT: Straightened cervical lordosis. Minimal grade 1 C4-5 anterolisthesis. VERTEBRAE/DISCS: Vertebral bodies are intact. Moderate C3-4 disc height loss, mild at C5-6. Decreased T2 signal within all disc compatible with desiccation. Moderate chronic discogenic endplate changes C0-0, mild at C3-4, C4-5 and C6-7. Bright STIR signal LEFT C6-7 facet. CORD:Cervical spinal cord is normal morphology and signal characteristics from the cervicomedullary junction to level of T1-2, the most caudal well visualized level. POSTERIOR FOSSA, VERTEBRAL ARTERIES, PARASPINAL TISSUES: No MR findings of ligamentous injury. Trace bright STIR signal about the  LEFT C6-7 facet. Vertebral artery flow voids present. Included posterior fossa and paraspinal soft tissues are normal. DISC LEVELS: C2-3: Small broad-based central disc protrusion. Uncovertebral hypertrophy. Mild RIGHT facet arthropathy. No canal stenosis or neural foraminal narrowing. C3-4: Small broad-based disc bulge, uncovertebral hypertrophy. Moderate RIGHT, mild LEFT facet arthropathy. No canal stenosis. Severe RIGHT, moderate to severe LEFT neural foraminal narrowing. C4-5: Anterolisthesis. Annular bulging uncovertebral hypertrophy. Severe RIGHT, mild LEFT facet arthropathy. No canal stenosis. Moderate RIGHT neural foraminal narrowing. C5-6: Small broad-based  disc bulge, uncovertebral hypertrophy. Moderate facet arthropathy. No canal stenosis. Moderate RIGHT, severe LEFT neural foraminal narrowing. C6-7: Annular bulging, uncovertebral hypertrophy. Moderate bilateral facet arthropathy. No canal stenosis. Mild RIGHT neural foraminal narrowing. C7-T1: Small RIGHT central disc protrusion. Uncovertebral hypertrophy. Severe LEFT facet arthropathy, mild on the RIGHT. LEFT facet effusion is likely reactive. No canal stenosis or neural foraminal narrowing. 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 Mild chronic small vessel ischemic disease. MRA HEAD: Acute LEFT M2 segment occlusion. High-grade stenosis distal LEFT M1 segment suggesting thromboembolism. MRI CERVICAL SPINE: Straightened cervical lordosis, grade 1 C4-5 anterolisthesis on degenerative basis. LEFT C7-T1 facet edema and effusion, likely reactive. Degenerative cervical spine without canal stenosis. Multilevel neural foraminal narrowing: Severe on the RIGHT at C3-4, severe on the LEFT at C5-6. Acute findings discussed with and reconfirmed by Dr.Knott on 09/09/2016 at 6:52 pm. Electronically Signed   By: Elon Alas M.D.   On: 09/09/2016 18:53   Mr Cervical Spine Wo Contrast  Result Date: 09/09/2016 CLINICAL DATA:  RIGHT hand numbness.  History of hypertension. EXAM: MRI HEAD WITHOUT CONTRAST MRA HEAD WITHOUT CONTRAST MRI CERVICAL SPINE WITHOUT CONTRAST TECHNIQUE: Multiplanar, multiecho pulse sequences of the brain and surrounding structures were obtained without intravenous contrast. Angiographic images of the Circle of Willis were obtained using MRA technique without intravenous contrast. Multiplanar, multi echo pulse sequences of the cervical spine were obtained without contrast. COMPARISON:  CT HEAD September 09, 2016 1420 hours FINDINGS: MRI HEAD FINDINGS BRAIN: Multiple sub cm foci of reduced diffusion in LEFT frontal and  parietal cortices, LEFT periatrial white matter, and LEFT occipital lobe. Corresponding low ADC values in the larger foci. No susceptibility artifact to suggest hemorrhage. No susceptibility artifact to suggest hemorrhage. The ventricles and sulci are normal for patient's age. Patchy supratentorial white matter FLAIR T2 hyperintensities exclusive of the aforementioned abnormality compatible with mild chronic small vessel ischemic disease. No suspicious parenchymal signal, masses or mass effect. No abnormal extra-axial fluid collections. No extra-axial masses though, contrast enhanced sequences would be more sensitive. VASCULAR: Major dolicoectatic intracranial vascular flow voids present at skull base. SKULL AND UPPER CERVICAL SPINE: No abnormal sellar expansion. No suspicious calvarial bone marrow signal. Craniocervical junction maintained. SINUSES/ORBITS: Small RIGHT maxillary mucosal retention cyst. Mastoid air cells are well aerated. The included ocular globes and orbital contents are non-suspicious. OTHER: None. MRA HEAD FINDINGS ANTERIOR CIRCULATION: Dolicoectatic flow related enhancement of the included cervical, petrous, cavernous and supraclinoid internal carotid arteries. LEFT A1 segment developmentally dominant. Patent anterior communicating artery. Moderate to severe stenosis distal RIGHT M2 segment. Occluded LEFT M2 superior segment. Normal flow related enhancement of the anterior cerebral arteries, including distal segments. Mild luminal regularity. No aneurysm. POSTERIOR CIRCULATION: LEFT vertebral artery is dominant. Basilar artery is patent, with normal flow related enhancement of the main branch vessels. Robust RIGHT posterior communicating artery present. Flow related enhancement of the posterior cerebral arteries. Mild focal stenosis RIGHT P3 segment. Mild  luminal irregularity. No large vessel occlusion, high-grade stenosis, aneurysm. MRI CERVICAL SPINE FINDINGS ALIGNMENT: Straightened cervical  lordosis. Minimal grade 1 C4-5 anterolisthesis. VERTEBRAE/DISCS: Vertebral bodies are intact. Moderate C3-4 disc height loss, mild at C5-6. Decreased T2 signal within all disc compatible with desiccation. Moderate chronic discogenic endplate changes Z3-0, mild at C3-4, C4-5 and C6-7. Bright STIR signal LEFT C6-7 facet. CORD:Cervical spinal cord is normal morphology and signal characteristics from the cervicomedullary junction to level of T1-2, the most caudal well visualized level. POSTERIOR FOSSA, VERTEBRAL ARTERIES, PARASPINAL TISSUES: No MR findings of ligamentous injury. Trace bright STIR signal about the LEFT C6-7 facet. Vertebral artery flow voids present. Included posterior fossa and paraspinal soft tissues are normal. DISC LEVELS: C2-3: Small broad-based central disc protrusion. Uncovertebral hypertrophy. Mild RIGHT facet arthropathy. No canal stenosis or neural foraminal narrowing. C3-4: Small broad-based disc bulge, uncovertebral hypertrophy. Moderate RIGHT, mild LEFT facet arthropathy. No canal stenosis. Severe RIGHT, moderate to severe LEFT neural foraminal narrowing. C4-5: Anterolisthesis. Annular bulging uncovertebral hypertrophy. Severe RIGHT, mild LEFT facet arthropathy. No canal stenosis. Moderate RIGHT neural foraminal narrowing. C5-6: Small broad-based disc bulge, uncovertebral hypertrophy. Moderate facet arthropathy. No canal stenosis. Moderate RIGHT, severe LEFT neural foraminal narrowing. C6-7: Annular bulging, uncovertebral hypertrophy. Moderate bilateral facet arthropathy. No canal stenosis. Mild RIGHT neural foraminal narrowing. C7-T1: Small RIGHT central disc protrusion. Uncovertebral hypertrophy. Severe LEFT facet arthropathy, mild on the RIGHT. LEFT facet effusion is likely reactive. No canal stenosis or neural foraminal narrowing. 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 Mild chronic small vessel ischemic  disease. MRA HEAD: Acute LEFT M2 segment occlusion. High-grade stenosis distal LEFT M1 segment suggesting thromboembolism. MRI CERVICAL SPINE: Straightened cervical lordosis, grade 1 C4-5 anterolisthesis on degenerative basis. LEFT C7-T1 facet edema and effusion, likely reactive. Degenerative cervical spine without canal stenosis. Multilevel neural foraminal narrowing: Severe on the RIGHT at C3-4, severe on the LEFT at C5-6. Acute findings discussed with and reconfirmed by Dr.Knott on 09/09/2016 at 6:52 pm. Electronically Signed   By: Elon Alas M.D.   On: 09/09/2016 18:53   Mr Jodene Nam Head/brain QT Cm  Result Date: 09/09/2016 CLINICAL DATA:  RIGHT hand numbness.  History of hypertension. EXAM: MRI HEAD WITHOUT CONTRAST MRA HEAD WITHOUT CONTRAST MRI CERVICAL SPINE WITHOUT CONTRAST TECHNIQUE: Multiplanar, multiecho pulse sequences of the brain and surrounding structures were obtained without intravenous contrast. Angiographic images of the Circle of Willis were obtained using MRA technique without intravenous contrast. Multiplanar, multi echo pulse sequences of the cervical spine were obtained without contrast. COMPARISON:  CT HEAD September 09, 2016 1420 hours FINDINGS: MRI HEAD FINDINGS BRAIN: Multiple sub cm foci of reduced diffusion in LEFT frontal and parietal cortices, LEFT periatrial white matter, and LEFT occipital lobe. Corresponding low ADC values in the larger foci. No susceptibility artifact to suggest hemorrhage. No susceptibility artifact to suggest hemorrhage. The ventricles and sulci are normal for patient's age. Patchy supratentorial white matter FLAIR T2 hyperintensities exclusive of the aforementioned abnormality compatible with mild chronic small vessel ischemic disease. No suspicious parenchymal signal, masses or mass effect. No abnormal extra-axial fluid collections. No extra-axial masses though, contrast enhanced sequences would be more sensitive. VASCULAR: Major dolicoectatic  intracranial vascular flow voids present at skull base. SKULL AND UPPER CERVICAL SPINE: No abnormal sellar expansion. No suspicious calvarial bone marrow signal. Craniocervical junction maintained. SINUSES/ORBITS: Small RIGHT maxillary mucosal retention cyst. Mastoid air cells are well aerated. The included ocular globes and orbital contents are  non-suspicious. OTHER: None. MRA HEAD FINDINGS ANTERIOR CIRCULATION: Dolicoectatic flow related enhancement of the included cervical, petrous, cavernous and supraclinoid internal carotid arteries. LEFT A1 segment developmentally dominant. Patent anterior communicating artery. Moderate to severe stenosis distal RIGHT M2 segment. Occluded LEFT M2 superior segment. Normal flow related enhancement of the anterior cerebral arteries, including distal segments. Mild luminal regularity. No aneurysm. POSTERIOR CIRCULATION: LEFT vertebral artery is dominant. Basilar artery is patent, with normal flow related enhancement of the main branch vessels. Robust RIGHT posterior communicating artery present. Flow related enhancement of the posterior cerebral arteries. Mild focal stenosis RIGHT P3 segment. Mild luminal irregularity. No large vessel occlusion, high-grade stenosis, aneurysm. MRI CERVICAL SPINE FINDINGS ALIGNMENT: Straightened cervical lordosis. Minimal grade 1 C4-5 anterolisthesis. VERTEBRAE/DISCS: Vertebral bodies are intact. Moderate C3-4 disc height loss, mild at C5-6. Decreased T2 signal within all disc compatible with desiccation. Moderate chronic discogenic endplate changes E5-2, mild at C3-4, C4-5 and C6-7. Bright STIR signal LEFT C6-7 facet. CORD:Cervical spinal cord is normal morphology and signal characteristics from the cervicomedullary junction to level of T1-2, the most caudal well visualized level. POSTERIOR FOSSA, VERTEBRAL ARTERIES, PARASPINAL TISSUES: No MR findings of ligamentous injury. Trace bright STIR signal about the LEFT C6-7 facet. Vertebral artery flow  voids present. Included posterior fossa and paraspinal soft tissues are normal. DISC LEVELS: C2-3: Small broad-based central disc protrusion. Uncovertebral hypertrophy. Mild RIGHT facet arthropathy. No canal stenosis or neural foraminal narrowing. C3-4: Small broad-based disc bulge, uncovertebral hypertrophy. Moderate RIGHT, mild LEFT facet arthropathy. No canal stenosis. Severe RIGHT, moderate to severe LEFT neural foraminal narrowing. C4-5: Anterolisthesis. Annular bulging uncovertebral hypertrophy. Severe RIGHT, mild LEFT facet arthropathy. No canal stenosis. Moderate RIGHT neural foraminal narrowing. C5-6: Small broad-based disc bulge, uncovertebral hypertrophy. Moderate facet arthropathy. No canal stenosis. Moderate RIGHT, severe LEFT neural foraminal narrowing. C6-7: Annular bulging, uncovertebral hypertrophy. Moderate bilateral facet arthropathy. No canal stenosis. Mild RIGHT neural foraminal narrowing. C7-T1: Small RIGHT central disc protrusion. Uncovertebral hypertrophy. Severe LEFT facet arthropathy, mild on the RIGHT. LEFT facet effusion is likely reactive. No canal stenosis or neural foraminal narrowing. 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 Mild chronic small vessel ischemic disease. MRA HEAD: Acute LEFT M2 segment occlusion. High-grade stenosis distal LEFT M1 segment suggesting thromboembolism. MRI CERVICAL SPINE: Straightened cervical lordosis, grade 1 C4-5 anterolisthesis on degenerative basis. LEFT C7-T1 facet edema and effusion, likely reactive. Degenerative cervical spine without canal stenosis. Multilevel neural foraminal narrowing: Severe on the RIGHT at C3-4, severe on the LEFT at C5-6. Acute findings discussed with and reconfirmed by Dr.Knott on 09/09/2016 at 6:52 pm. Electronically Signed   By: Elon Alas M.D.   On: 09/09/2016 18:53    Assessment: 70 y.o. 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  Stroke Risk Factors - atrial fibrillation and hypertension  Plan: 1. HgbA1c, fasting lipid panel 2. PT consult, OT consult, Speech consult 3. Echocardiogram 4. Carotid dopplers 5. Prophylactic therapy-Antiplatelet med: Aspirin  6. Risk factor modification 7. Telemetry monitoring  C.R. Nicole Kindred, MD  [facility] [phone]  09/10/2016, 3:46 AM

## 2016-09-10 NOTE — Progress Notes (Signed)
Patient arrived to 3W, alert and oriented. Discussed the importance of an IV with patient, he still refuses unless during a medical emergency.

## 2016-09-10 NOTE — Plan of Care (Signed)
Problem: Education: Goal: Knowledge of disease or condition will improve Outcome: Completed/Met Date Met: 09/10/16 Ambulating independently in room and hallway. Goal: Knowledge of secondary prevention will improve Outcome: Completed/Met Date Met: 09/10/16 Discussed FAST with patient and the need to call EMS versus driving when exhibiting stoke symptoms.

## 2016-09-10 NOTE — Progress Notes (Signed)
ANTICOAGULATION CONSULT NOTE - Initial Consult  Pharmacy Consult for xarelto Indication: afib/CVA  No Known Allergies  Patient Measurements: Height: 6\' 3"  (190.5 cm) Weight: 227 lb 8 oz (103.2 kg) IBW/kg (Calculated) : 84.5   Vital Signs: Temp: 98.4 F (36.9 C) (09/20 0740) Temp Source: Axillary (09/20 0740) BP: 140/91 (09/20 1018) Pulse Rate: 68 (09/20 0740)  Labs:  Recent Labs  09/09/16 1410  HGB 15.0  HCT 43.6  PLT 190  CREATININE 1.22    Estimated Creatinine Clearance: 75.4 mL/min (by C-G formula based on SCr of 1.22 mg/dL).   Medical History: Past Medical History:  Diagnosis Date  . Atrial fibrillation (HCC)   . Dysrhythmia approx 2014   afib  . Hypertension   . Night muscle spasms    lower back and flank area  . Stroke [facility](HCC)    newly dx. 09/09/16    Medications:  Prescriptions Prior to Admission  Medication Sig Dispense Refill Last Dose  . aspirin EC 81 MG tablet Take 81 mg by mouth at bedtime.    09/08/2016 at 2200  . cholecalciferol (VITAMIN D) 1000 units tablet Take 1,000 Units by mouth every other day.    09/08/2016 at Unknown time  . docusate sodium (COLACE) 100 MG capsule Take 100-200 mg by mouth daily as needed for mild constipation.   Past Week at Unknown time  . flecainide (TAMBOCOR) 100 MG tablet Take 100 mg by mouth 2 (two) times daily.   09/09/2016 at Unknown time  . lisinopril (PRINIVIL,ZESTRIL) 40 MG tablet Take 20 mg by mouth daily.    09/08/2016 at Unknown time  . Multiple Vitamin (MULTIVITAMIN WITH MINERALS) TABS tablet Take 1 tablet by mouth daily.   09/08/2016 at Unknown time  . Omega-3 Fatty Acids (FISH OIL) 1000 MG CAPS Take 1,000 mg by mouth at bedtime.    09/08/2016 at Unknown time   Scheduled:  . atorvastatin  40 mg Oral q1800  . flecainide  100 mg Oral BID  . heparin subcutaneous  5,000 Units Subcutaneous Q8H  . pneumococcal 23 valent vaccine  0.5 mL Intramuscular Tomorrow-1000    Assessment: 70 yo male with afib and acute CVA.  Pharmacy consulted to dose Xarelto -CrCl ~ 75, CBC= WNL  Goal of Therapy:  Monitor platelets by anticoagulation protocol: Yes   Plan:   -xarelto 20mg  po daily -Will follow patient progress  Harland Germanndrew Kristen Bushway, Pharm D 09/10/2016 10:52 AM

## 2016-09-10 NOTE — Progress Notes (Signed)
Reviewed discharge instructions with patient and wife and they stated their understanding.  Reviewed signs and symptoms of stroke and FAST with patient and wife.  Instructed patient to resume home dose of Norvasc on Friday per Dr. Doristine ChurchSingh's instructions.  Patient wanting to followup with Cardiologist here in town.  Appointment with Gypsy BalsamAmber Seiler NP arranged.  Discharged home with wife via wheelchair.  Colman Caterarpley, Vonya Ohalloran Danielle

## 2016-09-10 NOTE — Progress Notes (Signed)
  Echocardiogram 2D Echocardiogram has been performed.  Mirely Pangle 09/10/2016, 9:40 AM

## 2016-09-10 NOTE — Consult Note (Signed)
CARDIOLOGY CONSULT NOTE       Patient ID: Tyler Ruiz MRN: [id number] DOB/AGE: 06-28-1946 70 y.o.  Admit date: 09/09/2016 Referring Physician: Thedore Mins Primary Physician: Everlean Cherry, MD Primary Cardiologist:  Wylene Simmer Reason for Consultation: PAF  Principal Problem:   Acute CVA (cerebrovascular accident) [facility]) Active Problems:   HTN (hypertension)   Cervical disc disease   Atrial fibrillation [facility])   HPI:  70 y.o. followed by Dr Lenard Forth and Dr Sampson Goon. For PAF. Had a series of DCC;s 3 years ago. Has been in NSR over 3 years Changed from amiodarone to flecainide 8 months ago due to concern for long term side effects. He was never aware of his PAF No palpitations , chest pain or dyspnea. Admitted with TIA symptoms slurred speech and right arm/hand numbness.  Resolved now. Telemetry with lots of artifact but NSR.  No history of structural heart dx. Echo and carotids ordered.  MRI confirms sub cm left MCA infarct. Patient had been on xarelto in past with no issues.   ROS All other systems reviewed and negative except as noted above  Past Medical History:  Diagnosis Date  . Atrial fibrillation (HCC)   . Dysrhythmia approx 2014   afib  . Hypertension   . Night muscle spasms    lower back and flank area  . Stroke [facility])    newly dx. 09/09/16    Family History  Problem Relation Age of Onset  . Stroke Mother     in age 35's  . AAA (abdominal aortic aneurysm) Father     Social History   Social History  . Marital status: Married    Spouse name: N/A  . Number of children: N/A  . Years of education: N/A   Occupational History  . Not on file.   Social History Main Topics  . Smoking status: Never Smoker  . Smokeless tobacco: Never Used  . Alcohol use Yes     Comment: approx. 1 beer every week  . Drug use: No  . Sexual activity: No   Other Topics Concern  . Not on file   Social History Narrative  . No narrative on file    Past Surgical History:    Procedure Laterality Date  . CARDIOVERSION     x 2  . dental implant x 1 tooth    . DENTAL SURGERY    . nondisplaced right shoulder fx    . SKIN CANCER EXCISION    . skin cancer removal     from back - no further treatment     . aspirin  300 mg Rectal Daily   Or  . aspirin  325 mg Oral Daily  . atorvastatin  40 mg Oral q1800  . flecainide  100 mg Oral BID  . heparin subcutaneous  5,000 Units Subcutaneous Q8H  . pneumococcal 23 valent vaccine  0.5 mL Intramuscular Tomorrow-1000      Physical Exam: Blood pressure (!) 143/89, pulse 68, temperature 98.4 F (36.9 C), temperature source Axillary, resp. rate 17, height 6\' 3"  (1.905 m), weight 103.2 kg (227 lb 8 oz), SpO2 96 %.   Affect appropriate Healthy:  appears stated age HEENT: normal Neck supple with no adenopathy JVP normal no bruits no thyromegaly Lungs clear with no wheezing and good diaphragmatic motion Heart:  S1/S2 no murmur, no rub, gallop or click PMI normal Abdomen: benighn, BS positve, no tenderness, no AAA no bruit.  No HSM or HJR Distal pulses intact with no bruits No edema  Neuro non-focal Skin warm and dry No muscular weakness   Labs:   Lab Results  Component Value Date   WBC 7.3 09/09/2016   HGB 15.0 09/09/2016   HCT 43.6 09/09/2016   MCV 86.0 09/09/2016   PLT 190 09/09/2016    Recent Labs Lab 09/09/16 1410 09/10/16 0454  NA 140  --   K 4.7  --   CL 105  --   CO2 29  --   BUN 16  --   CREATININE 1.22  --   CALCIUM 9.4  --   PROT  --  6.1*  BILITOT  --  1.2  ALKPHOS  --  53  ALT  --  14*  AST  --  16  GLUCOSE 91  --    No results found for: CKTOTAL, CKMB, CKMBINDEX, TROPONINI Lab Results  Component Value Date   CHOL 208 (H) 09/10/2016   Lab Results  Component Value Date   HDL 40 (L) 09/10/2016   Lab Results  Component Value Date   LDLCALC 149 (H) 09/10/2016   Lab Results  Component Value Date   TRIG 97 09/10/2016   Lab Results  Component Value Date   CHOLHDL 5.2  09/10/2016   No results found for: LDLDIRECT    Radiology: Ct Head Wo Contrast  Result Date: 09/09/2016 CLINICAL DATA:  Right arm numbness. EXAM: CT HEAD WITHOUT CONTRAST TECHNIQUE: Contiguous axial images were obtained from the base of the skull through the vertex without intravenous contrast. COMPARISON:  12/31/2015 FINDINGS: Brain: Small focus of cortical blurring in the posterior left frontal region, possible acute infarct. Elsewhere negative brain without hemorrhage, hydrocephalus, or mass lesion. Vascular: Extensive atherosclerosis with probable generalized ectasia. No hyperdense vessel sign. Skull: Normal. Negative for fracture or focal lesion. Sinuses/Orbits: No acute finding. Other: None. IMPRESSION: Suspected small acute infarct in the posterior left frontal cortex. MRI is suggested. Electronically Signed   By: Marnee Spring M.D.   On: 09/09/2016 14:30   Mr Brain Wo Contrast  Result Date: 09/09/2016 CLINICAL DATA:  RIGHT hand numbness.  History of hypertension. EXAM: MRI HEAD WITHOUT CONTRAST MRA HEAD WITHOUT CONTRAST MRI CERVICAL SPINE WITHOUT CONTRAST TECHNIQUE: Multiplanar, multiecho pulse sequences of the brain and surrounding structures were obtained without intravenous contrast. Angiographic images of the Circle of Willis were obtained using MRA technique without intravenous contrast. Multiplanar, multi echo pulse sequences of the cervical spine were obtained without contrast. COMPARISON:  CT HEAD September 09, 2016 1420 hours FINDINGS: MRI HEAD FINDINGS BRAIN: Multiple sub cm foci of reduced diffusion in LEFT frontal and parietal cortices, LEFT periatrial white matter, and LEFT occipital lobe. Corresponding low ADC values in the larger foci. No susceptibility artifact to suggest hemorrhage. No susceptibility artifact to suggest hemorrhage. The ventricles and sulci are normal for patient's age. Patchy supratentorial white matter FLAIR T2 hyperintensities exclusive of the aforementioned  abnormality compatible with mild chronic small vessel ischemic disease. No suspicious parenchymal signal, masses or mass effect. No abnormal extra-axial fluid collections. No extra-axial masses though, contrast enhanced sequences would be more sensitive. VASCULAR: Major dolicoectatic intracranial vascular flow voids present at skull base. SKULL AND UPPER CERVICAL SPINE: No abnormal sellar expansion. No suspicious calvarial bone marrow signal. Craniocervical junction maintained. SINUSES/ORBITS: Small RIGHT maxillary mucosal retention cyst. Mastoid air cells are well aerated. The included ocular globes and orbital contents are non-suspicious. OTHER: None. MRA HEAD FINDINGS ANTERIOR CIRCULATION: Dolicoectatic flow related enhancement of the included cervical, petrous, cavernous and supraclinoid internal carotid arteries. LEFT  A1 segment developmentally dominant. Patent anterior communicating artery. Moderate to severe stenosis distal RIGHT M2 segment. Occluded LEFT M2 superior segment. Normal flow related enhancement of the anterior cerebral arteries, including distal segments. Mild luminal regularity. No aneurysm. POSTERIOR CIRCULATION: LEFT vertebral artery is dominant. Basilar artery is patent, with normal flow related enhancement of the main branch vessels. Robust RIGHT posterior communicating artery present. Flow related enhancement of the posterior cerebral arteries. Mild focal stenosis RIGHT P3 segment. Mild luminal irregularity. No large vessel occlusion, high-grade stenosis, aneurysm. MRI CERVICAL SPINE FINDINGS ALIGNMENT: Straightened cervical lordosis. Minimal grade 1 C4-5 anterolisthesis. VERTEBRAE/DISCS: Vertebral bodies are intact. Moderate C3-4 disc height loss, mild at C5-6. Decreased T2 signal within all disc compatible with desiccation. Moderate chronic discogenic endplate changes C5-6, mild at C3-4, C4-5 and C6-7. Bright STIR signal LEFT C6-7 facet. CORD:Cervical spinal cord is normal morphology and  signal characteristics from the cervicomedullary junction to level of T1-2, the most caudal well visualized level. POSTERIOR FOSSA, VERTEBRAL ARTERIES, PARASPINAL TISSUES: No MR findings of ligamentous injury. Trace bright STIR signal about the LEFT C6-7 facet. Vertebral artery flow voids present. Included posterior fossa and paraspinal soft tissues are normal. DISC LEVELS: C2-3: Small broad-based central disc protrusion. Uncovertebral hypertrophy. Mild RIGHT facet arthropathy. No canal stenosis or neural foraminal narrowing. C3-4: Small broad-based disc bulge, uncovertebral hypertrophy. Moderate RIGHT, mild LEFT facet arthropathy. No canal stenosis. Severe RIGHT, moderate to severe LEFT neural foraminal narrowing. C4-5: Anterolisthesis. Annular bulging uncovertebral hypertrophy. Severe RIGHT, mild LEFT facet arthropathy. No canal stenosis. Moderate RIGHT neural foraminal narrowing. C5-6: Small broad-based disc bulge, uncovertebral hypertrophy. Moderate facet arthropathy. No canal stenosis. Moderate RIGHT, severe LEFT neural foraminal narrowing. C6-7: Annular bulging, uncovertebral hypertrophy. Moderate bilateral facet arthropathy. No canal stenosis. Mild RIGHT neural foraminal narrowing. C7-T1: Small RIGHT central disc protrusion. Uncovertebral hypertrophy. Severe LEFT facet arthropathy, mild on the RIGHT. LEFT facet effusion is likely reactive. No canal stenosis or neural foraminal narrowing. 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 Mild chronic small vessel ischemic disease. MRA HEAD: Acute LEFT M2 segment occlusion. High-grade stenosis distal LEFT M1 segment suggesting thromboembolism. MRI CERVICAL SPINE: Straightened cervical lordosis, grade 1 C4-5 anterolisthesis on degenerative basis. LEFT C7-T1 facet edema and effusion, likely reactive. Degenerative cervical spine without canal stenosis. Multilevel neural foraminal narrowing:  Severe on the RIGHT at C3-4, severe on the LEFT at C5-6. Acute findings discussed with and reconfirmed by Dr.Knott on 09/09/2016 at 6:52 pm. Electronically Signed   By: Awilda Metro M.D.   On: 09/09/2016 18:53   Mr Cervical Spine Wo Contrast  Result Date: 09/09/2016 CLINICAL DATA:  RIGHT hand numbness.  History of hypertension. EXAM: MRI HEAD WITHOUT CONTRAST MRA HEAD WITHOUT CONTRAST MRI CERVICAL SPINE WITHOUT CONTRAST TECHNIQUE: Multiplanar, multiecho pulse sequences of the brain and surrounding structures were obtained without intravenous contrast. Angiographic images of the Circle of Willis were obtained using MRA technique without intravenous contrast. Multiplanar, multi echo pulse sequences of the cervical spine were obtained without contrast. COMPARISON:  CT HEAD September 09, 2016 1420 hours FINDINGS: MRI HEAD FINDINGS BRAIN: Multiple sub cm foci of reduced diffusion in LEFT frontal and parietal cortices, LEFT periatrial white matter, and LEFT occipital lobe. Corresponding low ADC values in the larger foci. No susceptibility artifact to suggest hemorrhage. No susceptibility artifact to suggest hemorrhage. The ventricles and sulci are normal for patient's age. Patchy supratentorial white matter FLAIR T2 hyperintensities exclusive of the aforementioned abnormality compatible with mild  chronic small vessel ischemic disease. No suspicious parenchymal signal, masses or mass effect. No abnormal extra-axial fluid collections. No extra-axial masses though, contrast enhanced sequences would be more sensitive. VASCULAR: Major dolicoectatic intracranial vascular flow voids present at skull base. SKULL AND UPPER CERVICAL SPINE: No abnormal sellar expansion. No suspicious calvarial bone marrow signal. Craniocervical junction maintained. SINUSES/ORBITS: Small RIGHT maxillary mucosal retention cyst. Mastoid air cells are well aerated. The included ocular globes and orbital contents are non-suspicious. OTHER: None.  MRA HEAD FINDINGS ANTERIOR CIRCULATION: Dolicoectatic flow related enhancement of the included cervical, petrous, cavernous and supraclinoid internal carotid arteries. LEFT A1 segment developmentally dominant. Patent anterior communicating artery. Moderate to severe stenosis distal RIGHT M2 segment. Occluded LEFT M2 superior segment. Normal flow related enhancement of the anterior cerebral arteries, including distal segments. Mild luminal regularity. No aneurysm. POSTERIOR CIRCULATION: LEFT vertebral artery is dominant. Basilar artery is patent, with normal flow related enhancement of the main branch vessels. Robust RIGHT posterior communicating artery present. Flow related enhancement of the posterior cerebral arteries. Mild focal stenosis RIGHT P3 segment. Mild luminal irregularity. No large vessel occlusion, high-grade stenosis, aneurysm. MRI CERVICAL SPINE FINDINGS ALIGNMENT: Straightened cervical lordosis. Minimal grade 1 C4-5 anterolisthesis. VERTEBRAE/DISCS: Vertebral bodies are intact. Moderate C3-4 disc height loss, mild at C5-6. Decreased T2 signal within all disc compatible with desiccation. Moderate chronic discogenic endplate changes C5-6, mild at C3-4, C4-5 and C6-7. Bright STIR signal LEFT C6-7 facet. CORD:Cervical spinal cord is normal morphology and signal characteristics from the cervicomedullary junction to level of T1-2, the most caudal well visualized level. POSTERIOR FOSSA, VERTEBRAL ARTERIES, PARASPINAL TISSUES: No MR findings of ligamentous injury. Trace bright STIR signal about the LEFT C6-7 facet. Vertebral artery flow voids present. Included posterior fossa and paraspinal soft tissues are normal. DISC LEVELS: C2-3: Small broad-based central disc protrusion. Uncovertebral hypertrophy. Mild RIGHT facet arthropathy. No canal stenosis or neural foraminal narrowing. C3-4: Small broad-based disc bulge, uncovertebral hypertrophy. Moderate RIGHT, mild LEFT facet arthropathy. No canal stenosis.  Severe RIGHT, moderate to severe LEFT neural foraminal narrowing. C4-5: Anterolisthesis. Annular bulging uncovertebral hypertrophy. Severe RIGHT, mild LEFT facet arthropathy. No canal stenosis. Moderate RIGHT neural foraminal narrowing. C5-6: Small broad-based disc bulge, uncovertebral hypertrophy. Moderate facet arthropathy. No canal stenosis. Moderate RIGHT, severe LEFT neural foraminal narrowing. C6-7: Annular bulging, uncovertebral hypertrophy. Moderate bilateral facet arthropathy. No canal stenosis. Mild RIGHT neural foraminal narrowing. C7-T1: Small RIGHT central disc protrusion. Uncovertebral hypertrophy. Severe LEFT facet arthropathy, mild on the RIGHT. LEFT facet effusion is likely reactive. No canal stenosis or neural foraminal narrowing. 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 Mild chronic small vessel ischemic disease. MRA HEAD: Acute LEFT M2 segment occlusion. High-grade stenosis distal LEFT M1 segment suggesting thromboembolism. MRI CERVICAL SPINE: Straightened cervical lordosis, grade 1 C4-5 anterolisthesis on degenerative basis. LEFT C7-T1 facet edema and effusion, likely reactive. Degenerative cervical spine without canal stenosis. Multilevel neural foraminal narrowing: Severe on the RIGHT at C3-4, severe on the LEFT at C5-6. Acute findings discussed with and reconfirmed by Dr.Knott on 09/09/2016 at 6:52 pm. Electronically Signed   By: Awilda Metro M.D.   On: 09/09/2016 18:53   Mr Maxine Glenn Head/brain ZO Cm  Result Date: 09/09/2016 CLINICAL DATA:  RIGHT hand numbness.  History of hypertension. EXAM: MRI HEAD WITHOUT CONTRAST MRA HEAD WITHOUT CONTRAST MRI CERVICAL SPINE WITHOUT CONTRAST TECHNIQUE: Multiplanar, multiecho pulse sequences of the brain and surrounding structures were obtained without intravenous contrast. Angiographic images of the  Circle of Willis were obtained using MRA technique without intravenous  contrast. Multiplanar, multi echo pulse sequences of the cervical spine were obtained without contrast. COMPARISON:  CT HEAD September 09, 2016 1420 hours FINDINGS: MRI HEAD FINDINGS BRAIN: Multiple sub cm foci of reduced diffusion in LEFT frontal and parietal cortices, LEFT periatrial white matter, and LEFT occipital lobe. Corresponding low ADC values in the larger foci. No susceptibility artifact to suggest hemorrhage. No susceptibility artifact to suggest hemorrhage. The ventricles and sulci are normal for patient's age. Patchy supratentorial white matter FLAIR T2 hyperintensities exclusive of the aforementioned abnormality compatible with mild chronic small vessel ischemic disease. No suspicious parenchymal signal, masses or mass effect. No abnormal extra-axial fluid collections. No extra-axial masses though, contrast enhanced sequences would be more sensitive. VASCULAR: Major dolicoectatic intracranial vascular flow voids present at skull base. SKULL AND UPPER CERVICAL SPINE: No abnormal sellar expansion. No suspicious calvarial bone marrow signal. Craniocervical junction maintained. SINUSES/ORBITS: Small RIGHT maxillary mucosal retention cyst. Mastoid air cells are well aerated. The included ocular globes and orbital contents are non-suspicious. OTHER: None. MRA HEAD FINDINGS ANTERIOR CIRCULATION: Dolicoectatic flow related enhancement of the included cervical, petrous, cavernous and supraclinoid internal carotid arteries. LEFT A1 segment developmentally dominant. Patent anterior communicating artery. Moderate to severe stenosis distal RIGHT M2 segment. Occluded LEFT M2 superior segment. Normal flow related enhancement of the anterior cerebral arteries, including distal segments. Mild luminal regularity. No aneurysm. POSTERIOR CIRCULATION: LEFT vertebral artery is dominant. Basilar artery is patent, with normal flow related enhancement of the main branch vessels. Robust RIGHT posterior communicating artery  present. Flow related enhancement of the posterior cerebral arteries. Mild focal stenosis RIGHT P3 segment. Mild luminal irregularity. No large vessel occlusion, high-grade stenosis, aneurysm. MRI CERVICAL SPINE FINDINGS ALIGNMENT: Straightened cervical lordosis. Minimal grade 1 C4-5 anterolisthesis. VERTEBRAE/DISCS: Vertebral bodies are intact. Moderate C3-4 disc height loss, mild at C5-6. Decreased T2 signal within all disc compatible with desiccation. Moderate chronic discogenic endplate changes C5-6, mild at C3-4, C4-5 and C6-7. Bright STIR signal LEFT C6-7 facet. CORD:Cervical spinal cord is normal morphology and signal characteristics from the cervicomedullary junction to level of T1-2, the most caudal well visualized level. POSTERIOR FOSSA, VERTEBRAL ARTERIES, PARASPINAL TISSUES: No MR findings of ligamentous injury. Trace bright STIR signal about the LEFT C6-7 facet. Vertebral artery flow voids present. Included posterior fossa and paraspinal soft tissues are normal. DISC LEVELS: C2-3: Small broad-based central disc protrusion. Uncovertebral hypertrophy. Mild RIGHT facet arthropathy. No canal stenosis or neural foraminal narrowing. C3-4: Small broad-based disc bulge, uncovertebral hypertrophy. Moderate RIGHT, mild LEFT facet arthropathy. No canal stenosis. Severe RIGHT, moderate to severe LEFT neural foraminal narrowing. C4-5: Anterolisthesis. Annular bulging uncovertebral hypertrophy. Severe RIGHT, mild LEFT facet arthropathy. No canal stenosis. Moderate RIGHT neural foraminal narrowing. C5-6: Small broad-based disc bulge, uncovertebral hypertrophy. Moderate facet arthropathy. No canal stenosis. Moderate RIGHT, severe LEFT neural foraminal narrowing. C6-7: Annular bulging, uncovertebral hypertrophy. Moderate bilateral facet arthropathy. No canal stenosis. Mild RIGHT neural foraminal narrowing. C7-T1: Small RIGHT central disc protrusion. Uncovertebral hypertrophy. Severe LEFT facet arthropathy, mild on the  RIGHT. LEFT facet effusion is likely reactive. No canal stenosis or neural foraminal narrowing. 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 Mild chronic small vessel ischemic disease. MRA HEAD: Acute LEFT M2 segment occlusion. High-grade stenosis distal LEFT M1 segment suggesting thromboembolism. MRI CERVICAL SPINE: Straightened cervical lordosis, grade 1 C4-5 anterolisthesis on degenerative basis. LEFT C7-T1 facet edema and effusion, likely reactive.  Degenerative cervical spine without canal stenosis. Multilevel neural foraminal narrowing: Severe on the RIGHT at C3-4, severe on the LEFT at C5-6. Acute findings discussed with and reconfirmed by Dr.Knott on 09/09/2016 at 6:52 pm. Electronically Signed   By: Awilda Metro M.D.   On: 09/09/2016 18:53    EKG:  SR nonspecific ST changes   ASSESSMENT AND PLAN:   TIA:  In setting of known PAF. Start xarelto with sub cm lesion should be ok to start today  Outpatient f/u with his cardiologist Should probably have loop recorder to document how Frequent PAF is to further titrate flecainide or consider alternative AAT.  This patients CHA2DS2-VASc Score and unadjusted Ischemic Stroke Rate (% per year) is equal to 4.8 % stroke rate/year from a score of 4  Above score calculated as 1 point each if present [CHF, HTN, DM, Vascular=MI/PAD/Aortic Plaque, Age if 65-74, or Male] Above score calculated as 2 points each if present [Age > 75, or Stroke/TIA/TE]  Signed: Charlton Haws 09/10/2016, 10:34 AM

## 2016-09-10 NOTE — Evaluation (Signed)
Physical Therapy Evaluation Patient Details Name: Tyler Ruiz MRN: [id number] DOB: [birth year] Today's Date: 09/10/2016   History of Present Illness  HPI: Tyler Ruiz is a 70 y.o. gentleman with a history of paroxysmal atrial fibrillation (S/P cardioversion x 3, previously anticoagulated with Xarelto for about six months, NOT on chronic anticoagulation at this time) and HTN who presents to the ED at [facility] for evaluation of right upper extremity numbness. Head CT actually suggested small acute infarct of the posterior left frontal cortex.  Clinical Impression   Patient evaluated by Physical Therapy with no further acute PT needs identified. All education has been completed and the patient has no further questions.  See below for any follow-up Physical Therapy or equipment needs. PT is signing off. Thank you for this referral.     Follow Up Recommendations No PT follow up    Equipment Recommendations  None recommended by PT    Recommendations for Other Services       Precautions / Restrictions Precautions Precautions: None      Mobility  Bed Mobility Overal bed mobility: Independent                Transfers Overall transfer level: Independent Equipment used: None                Ambulation/Gait Ambulation/Gait assistance: Independent Ambulation Distance (Feet): 250 Feet Assistive device: None Gait Pattern/deviations: WFL(Within Functional Limits)        Stairs Stairs: Yes Stairs assistance: Modified independent (Device/Increase time) Stair Management: One rail Right;Forwards;Alternating pattern Number of Stairs: 12 General stair comments: No difficulty  Wheelchair Mobility    Modified Rankin (Stroke Patients Only)       Balance Overall balance assessment: No apparent balance deficits (not formally assessed)                                           Pertinent Vitals/Pain Pain Assessment: No/denies pain     Home Living Family/patient expects to be discharged to:: Private residence Living Arrangements: Spouse/significant other Available Help at Discharge: Family Type of Home: House Home Access: Stairs to enter   [employer] of Steps: 2 Home Layout: One level;Other (Comment) (office is above the garage, 15 steps with railing on R side) Home Equipment: None      Prior Function Level of Independence: Independent               Hand Dominance   Dominant Hand: Right    Extremity/Trunk Assessment   Upper Extremity Assessment: Overall WFL for tasks assessed           Lower Extremity Assessment: Overall WFL for tasks assessed         Communication   Communication: No difficulties  Cognition Arousal/Alertness: Awake/alert Behavior During Therapy: WFL for tasks assessed/performed Overall Cognitive Status: Within Functional Limits for tasks assessed                      General Comments General comments (skin integrity, edema, etc.): Pt educated in signs and symptoms of stroke and action to take    Exercises     Assessment/Plan    PT Assessment Patent does not need any further PT services  PT Problem List            PT Treatment Interventions      PT Goals (Current goals can  be found in the Care Plan section)  Acute Rehab PT Goals Patient Stated Goal: go home PT Goal Formulation: All assessment and education complete, DC therapy    Frequency     Barriers to discharge        Co-evaluation PT/OT/SLP Co-Evaluation/Treatment: Yes Reason for Co-Treatment: Other (comment) (Pt-centered decision to co-session to expedite dc) PT goals addressed during session: Mobility/safety with mobility         End of Session   Activity Tolerance: Patient tolerated treatment well Patient left: Other (comment) (managing independently in room) Nurse Communication: Mobility status         Time: [id number] PT Time Calculation (min) (ACUTE ONLY):  11 min   Charges:   PT Evaluation $PT Eval Low Complexity: 1 Procedure     PT G CodesOlen Pel:        Artha Stavros Hamff 09/10/2016, 3:39 PM Van ClinesHolly Bently Wyss, [place]PT  Acute Rehabilitation Services Pager [phone] Office [phone]

## 2016-09-10 NOTE — Evaluation (Signed)
Speech Language Pathology Evaluation Patient Details Name: Tyler PintoStephen Ruiz MRN: [id number] DOB: 11-25-1946 Today's Date: 09/10/2016 Time: [id number] SLP Time Calculation (min) (ACUTE ONLY): 10 min  Problem List:  Patient Active Problem List   Diagnosis Date Noted  . Acute CVA (cerebrovascular accident) (HCC) 09/09/2016  . HTN (hypertension) 09/09/2016  . Cervical disc disease 09/09/2016  . Atrial fibrillation (HCC) 09/09/2016   Past Medical History:  Past Medical History:  Diagnosis Date  . Atrial fibrillation (HCC)   . Dysrhythmia approx 2014   afib  . Hypertension   . Night muscle spasms    lower back and flank area  . Stroke [facility](HCC)    newly dx. 09/09/16   Past Surgical History:  Past Surgical History:  Procedure Laterality Date  . CARDIOVERSION     x 2  . dental implant x 1 tooth    . DENTAL SURGERY    . nondisplaced right shoulder fx    . SKIN CANCER EXCISION    . skin cancer removal     from back - no further treatment   HPI:  Tyler Ruiz is an 70 y.o. male with a history of atrial fibrillation and successful cardioversion and hypertension was [facility] ED and [facility] for recurrent episodes of numbness involving his right hand. First episode occurred at 9 AM on 09/08/2016, and subsequently resolved. He is a recurrent episode of similar symptoms on 09/09/2016 with associated slurred speech. Symptoms resolved in about one hour. CT scan of the head showed a suspected acute infarct in the history left frontal cortex. MRI showed multiple subcentimeter left MCA territory and MCA watershed infarcts consistent with thromboembolic embolism.    Assessment / Plan / Recommendation Clinical Impression  Portions of [place] Diagnostic Aphasia Exam were administered with findings of normal expressive language, normal fluency and clarity of speech; preserved word retrieval, ability to follow complex commands and read and answer progressively complex questions.  No  deficits identified.  Reviewed FAST acronym, s/s of stroke.  Pt/wife verbalize understanding.     SLP Assessment  Patient does not need any further Speech Lanaguage Pathology Services    Follow Up Recommendations       Frequency and Duration           SLP Evaluation Cognition  Overall Cognitive Status: Within Functional Limits for tasks assessed Orientation Level: Oriented X4 Safety/Judgment: Appears intact       Comprehension  Auditory Comprehension Overall Auditory Comprehension: Appears within functional limits for tasks assessed Yes/No Questions: Within Functional Limits Commands: Within Functional Limits Conversation: Complex Visual Recognition/Discrimination Discrimination: Within Function Limits Reading Comprehension Reading Status: Within funtional limits    Expression Expression Primary Mode of Expression: Verbal Verbal Expression Overall Verbal Expression: Appears within functional limits for tasks assessed Initiation: No impairment Level of Generative/Spontaneous Verbalization: Conversation Repetition: No impairment Naming: No impairment Pragmatics: No impairment Written Expression Dominant Hand: Right Written Expression: Not tested   Oral / Motor  Oral Motor/Sensory Function Overall Oral Motor/Sensory Function: Within functional limits Motor Speech Overall Motor Speech: Appears within functional limits for tasks assessed   GO                    Tyler Ruiz, Tyler Ruiz 09/10/2016, 2:09 PM  Brynnlie Unterreiner L. Samson Fredericouture, [place] CCC/SLP Pager [phone]

## 2016-09-10 NOTE — Care Management Note (Addendum)
Case Management Note  Patient Details  Name: Tyler PintoStephen Wirsing MRN: [id number] Date of Birth: 03-22-1946  Subjective/Objective:     Pt presented for Right forearm and hand numbness. MRI confirms sub cm left MCA infarct. Pt was previously on Xarelto in the past.               Action/Plan: Benefits check in process in regards to Xarelto. CM will make pt aware of co pay once completed.  Expected Discharge Date:   (unknown)               Expected Discharge Plan:  Home w Home Health Services  In-House Referral:     Discharge planning Services  CM Consult, Medication Assistance  Post Acute Care Choice:   N/A Choice offered to:   N/A  DME Arranged:   N/A DME Agency:   N/A  HH Arranged:   N/A HH Agency:   N/A  Status of Service: Completed.  If discussed at Long Length of Stay Meetings, dates discussed:    Additional Comments: 1324 09-10-16 Tomi BambergerBrenda Graves-Bigelow, RN,BSN [phone] Plan will be for d/c today on Xarelto. No HH Needs at this time. CM did call [facility] and medication is available. No further needs from CM at this time.  S/W SAMANTHA @ [employer] [facility] # [phone]   XARELTO 15 MG BID FOR 21 DAYS    AND 20 MG DAILY   COVER- YES                            YES  CO-PAY- $ 47.00                          SAME  TIER- 3 DRUG                           3 DRUG  PRIOR APPROVAL -NO                    NO  PHARMACY : [facility]  MAIL-ORDER FOR 90 DAY SUPPLY $ 131.00 Gala LewandowskyGraves-Bigelow, Kohler Pellerito Kaye, RN 09/10/2016, 10:48 AM

## 2016-09-10 NOTE — Progress Notes (Signed)
*  PRELIMINARY RESULTS* Vascular Ultrasound Carotid Duplex (Doppler) has been completed.  Preliminary findings: Bilateral: No significant (1-39%) ICA stenosis. Antegrade vertebral flow.   Farrel DemarkJill Eunice, RDMS, RVT  09/10/2016, 10:00 AM

## 2016-09-10 NOTE — Progress Notes (Signed)
STROKE TEAM PROGRESS NOTE   HISTORY OF PRESENT ILLNESS (per record) Tyler Ruiz is an 70 y.o. male with a history of atrial fibrillation and successful cardioversion and hypertension was evaluated in the ED and [facility] for recurrent episodes of numbness involving his right hand. First episode occurred at 9 AM on 09/08/2016, and subsequently resolved. He is a recurrent episode of similar symptoms on 09/09/2016 with associated slurred speech. Symptoms resolved in about one hour. CT scan of the head showed a suspected acute infarct in the history left frontal cortex. MRI showed multiple subcentimeter left MCA territory and MCA watershed infarcts consistent with thromboembolic embolism. MR angiogram of the head showed acute left him to segment occlusion, as well as high-grade distal left M1 stenosis. NIH stroke score at the time of this evaluation was 0.   LSN: 9 AM on 09/08/2016 tPA Given: No: Deficits resolved mRankin:    SUBJECTIVE (INTERVAL HISTORY) Multiple family members present. Dr. Pearlean Ruiz discussed the patient's case and answered questions. The patient states he was on Xarelto in the past but taken off it as per his cardiologist.   OBJECTIVE Temp:  [98.4 F (36.9 C)-99.3 F (37.4 C)] 98.4 F (36.9 C) (09/20 0740) Pulse Rate:  [67-71] 68 (09/20 0740) Cardiac Rhythm: Normal sinus rhythm (09/20 0303) Resp:  [16-18] 17 (09/20 0740) BP: (115-152)/(81-99) 143/89 (09/20 0618) SpO2:  [96 %-98 %] 96 % (09/20 0740) Weight:  [100.7 kg (222 lb)-103.2 kg (227 lb 8 oz)] 103.2 kg (227 lb 8 oz) (09/20 0222)  CBC:  Recent Labs Lab 09/09/16 1410  WBC 7.3  HGB 15.0  HCT 43.6  MCV 86.0  PLT 190    Basic Metabolic Panel:  Recent Labs Lab 09/09/16 1410  NA 140  K 4.7  CL 105  CO2 29  GLUCOSE 91  BUN 16  CREATININE 1.22  CALCIUM 9.4    Lipid Panel:    Component Value Date/Time   CHOL 208 (H) 09/10/2016 0454   TRIG 97 09/10/2016 0454   HDL 40 (L) 09/10/2016 0454    CHOLHDL 5.2 09/10/2016 0454   VLDL 19 09/10/2016 0454   LDLCALC 149 (H) 09/10/2016 0454   HgbA1c: No results found for: HGBA1C Urine Drug Screen: No results found for: LABOPIA, COCAINSCRNUR, LABBENZ, AMPHETMU, THCU, LABBARB    IMAGING  Ct Head Wo Contrast 09/09/2016 Suspected small acute infarct in the posterior left frontal cortex. MRI is suggested.     Mr Tyler Ruiz Head/brain Wo Cm  09/09/2016  MRI HEAD:   Multiple acute sub cm LEFT MCA territory and left watershed infarcts consistent with thromboembolism. Mild chronic small vessel ischemic disease.   MRA HEAD:  Acute LEFT M2 segment occlusion. High-grade stenosis distal LEFT M1 segment suggesting thromboembolism.    MRI CERVICAL SPINE:  Straightened cervical lordosis, grade 1 C4-5 anterolisthesis on degenerative basis. LEFT C7-T1 facet edema and effusion, likely reactive. Degenerative cervical spine without canal stenosis.  Multilevel neural foraminal narrowing: Severe on the RIGHT at C3-4, severe on the LEFT at C5-6.       PHYSICAL EXAM  Pleasant middle aged Caucasian male not in distress. . Afebrile. Head is nontraumatic. Neck is supple without bruit.    Cardiac exam no murmur or gallop. Lungs are clear to auscultation. Distal pulses are well felt.   Neurological Exam ;  Awake  Alert oriented x 3. Normal speech and language.eye movements full without nystagmus.fundi were not visualized. Vision acuity and fields appear normal. Hearing is normal. Palatal movements are normal.  Face symmetric. Tongue midline. Normal strength, tone, reflexes and coordination. Normal sensation. Gait deferred.         ASSESSMENT/PLAN Mr. Tyler PintoStephen Ruiz is a 70 y.o. male with history of a previous stroke, hypertension, and atrial fibrillation without anticoagulation, presenting with right-sided numbness and slurred speech. He did not receive IV t-PA due to resolution of deficits.  Strokes:  Dominant infarcts felt to be embolic secondary  to atrial fibrillation.  Resultant - resolution of deficits  MRI Multiple acute sub cm LEFT MCA territory and left watershed infarcts consistent with thromboembolism.  MRA Acute LEFT M2 segment occlusion. High-grade stenosis distal LEFT M1 segment suggesting thromboembolism.   Carotid Doppler - unremarkable  2D Echo - EF 60-65%.  No cardiac source of emboli identified.  LDL - 149  HgbA1c pending  VTE prophylaxis - Xarelto  Diet Heart Room service appropriate? Yes; Fluid consistency: Thin  aspirin 81 mg daily prior to admission, now on Xarelto (rivaroxaban) daily  Patient counseled to be compliant with his antithrombotic medications  Ongoing aggressive stroke risk factor management  Therapy recommendations: No follow-up therapy recommended  Disposition: The patient was discharged  Hypertension  Stable  Permissive hypertension (OK if < 220/120) but gradually normalize in 5-7 days  Long-term BP goal normotensive  Hyperlipidemia  Home meds:  Omega-3 fatty acids prior to admission  LDL 149, goal < 70  Lipitor 40 mg daily started  Continue statin at discharge   Other Stroke Risk Factors  Advanced age  Occasional ETOH use  Overweight, Body mass index is 28.44 kg/m., recommend weight loss, diet and exercise as appropriate   Hx stroke/TIA  Family hx stroke (Mother)  Atrial fibrillation without anticoagulation  Other Active Problems     Hospital day # 1  Delton SeeDavid Rinehuls PA-C [facility] Pager [phone] 09/10/2016, 2:14 PM  I have personally examined this patient, reviewed notes, independently viewed imaging studies, participated in medical decision making and plan of care.ROS completed by me personally and pertinent positives fully documented  I have made any additions or clarifications directly to the above note. Agree with note above.  He presented with transient speech difficulties and right hand numbness to left hemispheric embolic  branch in the cerebral artery infarcts likely embolic from atrial fibrillation. he remains at risk for neurological worsening, recurrent strokes and TIAs and needs long-term anticoagulation. Recommend he resume Xarelto and shuffling stroke workup. Greater than 50%  of time during this 35 minute visit was spent on counseling and coordination of care about his stroke, atrial fibrillation, prevention and treatment  Delia HeadyPramod Sethi, MD Medical Director [facility] Pager: [phone] 09/10/2016 4:13 PM    To contact Stroke Continuity provider, please refer to [email or web address]. After hours, contact General Neurology

## 2016-09-10 NOTE — Progress Notes (Addendum)
PROGRESS NOTE                                                                                                                                                                                                             Patient Demographics:    Tyler Ruiz, is a 70 y.o. male, DOB - 04/30/1946, MWU:[id number]  Admit date - 09/09/2016   Admitting Physician Michael Litter, MD  Outpatient Primary MD for the patient is Everlean Cherry, MD  LOS - 1  Chief Complaint  Patient presents with  . Numbness       Brief Narrative    Tyler Ruiz is a 70 y.o. gentleman with a history of paroxysmal atrial fibrillation (S/P cardioversion x 3, previously anticoagulated with Xarelto for about six months, NOT on chronic anticoagulation at this time) and HTN who presents to the ED at [facility] for evaluation of right upper extremity numbness.  The patient describes the acute onset of symptoms at [facility] on Monday morning.  No associated headache, vision disturbance, or actual weakness.  When symptoms persisted for several hours, he called his cardiologist.  Options for how to proceed with further evaluation were discussed.  The patient ultimately decided to schedule a follow-up appointment for today.  His RUE numbness persisted through the evening.  He did not take any additional medications besides his regular home medications, which include baby aspirin, in an attempt to alleviate his symptoms.  Symptoms had resolved when he awakened this morning, but they returned while he was attempting to do yard work.  His wife also noticed slurred speech (but no asymmetrical facial drooping), and the patient admits that he "generally was not feeling well".  This prompted presentation to the ED instead of waiting for his outpatient appointment.  He reports that his symptoms resolved around 5PM this evening.  He denies ever having significant hand or arm  weakness.  No RLE deficits.  No headache.  No palpitations.   Subjective:    [name] today has, No headache, No chest pain, No abdominal pain - No Nausea, No new weakness tingling or numbness, No Cough - SOB.     Assessment  & Plan :      1.Acute thromboembolic CVA. Likely due to paroxysmal atrial fibrillation, discussed his case with his primary cardiologist Dr. Sampson Goon at [facility], at  this time no contraindication to correlation. Stroke team is following, he is currently on aspirin, vascular ultrasound unremarkable, echocardiogram pending, LDL above 70 so placed on Lipitor, A1c pending, he most likely will require Eliquis/xaralto. We'll defer that to stroke team. Deficits have resolved.  2. Paroxysmal atrial fibrillation. Currently on flecainide and in sinus, [place] 2 score of at least 3 now which include stroke. Candidate for anticoagulation, will defer to cardiology and neurology, discussed his case with his primary cardiologist Dr. Bernadette HoitFitzjerald today who is agreeable with anticoagulation if needed.  3. Dyslipidemia. Placed on statin.  4.HTN - hold lisinopril for permissive hypertension for #1 above.    Family Communication  :  None  Code Status :  Full  Diet : Heart Healthy  Disposition Plan  :  Home 1-2 days  Consults  : cards, neuro  Procedures  :    Vascular Ultrasound - Carotid Duplex (Doppler) has been completed.  Preliminary findings: Bilateral: No significant (1-39%) ICA stenosis. Antegrade vertebral flow.   TTE  Left ventricle: The cavity size was normal. Wall thickness was increased in a pattern of mild LVH. Systolic function was normal. The estimated ejection fraction was in the range of 60% to 65%. Wall motion was normal; there were no regional wall motion abnormalities. Left ventricular diastolic function parameters were normal.   DVT Prophylaxis  :  Heparin    Lab Results  Component Value Date   PLT 190 09/09/2016    Inpatient  Medications  Scheduled Meds: . aspirin  300 mg Rectal Daily   Or  . aspirin  325 mg Oral Daily  . flecainide  100 mg Oral BID  . lisinopril  20 mg Oral Daily  . pneumococcal 23 valent vaccine  0.5 mL Intramuscular Tomorrow-1000   Continuous Infusions:  PRN Meds:.acetaminophen **OR** acetaminophen  Antibiotics  :    Anti-infectives    None         Objective:   Vitals:   09/10/16 0222 09/10/16 0413 09/10/16 0618 09/10/16 0740  BP: (!) 152/99 115/81 (!) 143/89   Pulse: 67   68  Resp: 18 17 18 17   Temp: 99.3 F (37.4 C)   98.4 F (36.9 C)  TempSrc: Oral   Axillary  SpO2: 97%   96%  Weight: 103.2 kg (227 lb 8 oz)     Height: 6\' 3"  (1.905 m)       Wt Readings from Last 3 Encounters:  09/10/16 103.2 kg (227 lb 8 oz)  03/08/12 102.1 kg (225 lb)     Intake/Output Summary (Last 24 hours) at 09/10/16 1014 Last data filed at 09/10/16 0843  Gross per 24 hour  Intake              600 ml  Output                0 ml  Net              600 ml     Physical Exam  Awake Alert, Oriented X 3, No new F.N deficits, Normal affect [place].AT,PERRAL Supple Neck,No JVD, No cervical lymphadenopathy appriciated.  Symmetrical Chest wall movement, Good air movement bilaterally, CTAB RRR,No Gallops,Rubs or new Murmurs, No Parasternal Heave +ve B.Sounds, Abd Soft, No tenderness, No organomegaly appriciated, No rebound - guarding or rigidity. No Cyanosis, Clubbing or edema, No new Rash or bruise       Data Review:    CBC  Recent Labs Lab 09/09/16 1410  WBC 7.3  HGB 15.0  HCT 43.6  PLT 190  MCV 86.0  MCH 29.6  MCHC 34.4  RDW 13.1    Chemistries   Recent Labs Lab 09/09/16 1410 09/10/16 0454  NA 140  --   K 4.7  --   CL 105  --   CO2 29  --   GLUCOSE 91  --   BUN 16  --   CREATININE 1.22  --   CALCIUM 9.4  --   AST  --  16  ALT  --  14*  ALKPHOS  --  53  BILITOT  --  1.2    ------------------------------------------------------------------------------------------------------------------  Recent Labs  09/10/16 0454  CHOL 208*  HDL 40*  LDLCALC 149*  TRIG 97  CHOLHDL 5.2    No results found for: HGBA1C ------------------------------------------------------------------------------------------------------------------ No results for input(s): TSH, T4TOTAL, T3FREE, THYROIDAB in the last 72 hours.  Invalid input(s): FREET3 ------------------------------------------------------------------------------------------------------------------ No results for input(s): VITAMINB12, FOLATE, FERRITIN, TIBC, IRON, RETICCTPCT in the last 72 hours.  Coagulation profile No results for input(s): INR, PROTIME in the last 168 hours.  No results for input(s): DDIMER in the last 72 hours.  Cardiac Enzymes No results for input(s): CKMB, TROPONINI, MYOGLOBIN in the last 168 hours.  Invalid input(s): CK ------------------------------------------------------------------------------------------------------------------ No results found for: BNP  Micro Results No results found for this or any previous visit (from the past 240 hour(s)).  Radiology Reports Ct Head Wo Contrast  Result Date: 09/09/2016 CLINICAL DATA:  Right arm numbness. EXAM: CT HEAD WITHOUT CONTRAST TECHNIQUE: Contiguous axial images were obtained from the base of the skull through the vertex without intravenous contrast. COMPARISON:  12/31/2015 FINDINGS: Brain: Small focus of cortical blurring in the posterior left frontal region, possible acute infarct. Elsewhere negative brain without hemorrhage, hydrocephalus, or mass lesion. Vascular: Extensive atherosclerosis with probable generalized ectasia. No hyperdense vessel sign. Skull: Normal. Negative for fracture or focal lesion. Sinuses/Orbits: No acute finding. Other: None. IMPRESSION: Suspected small acute infarct in the posterior left frontal cortex. MRI is  suggested. Electronically Signed   By: Marnee Spring M.D.   On: 09/09/2016 14:30   Mr Brain Wo Contrast  Result Date: 09/09/2016 CLINICAL DATA:  RIGHT hand numbness.  History of hypertension. EXAM: MRI HEAD WITHOUT CONTRAST MRA HEAD WITHOUT CONTRAST MRI CERVICAL SPINE WITHOUT CONTRAST TECHNIQUE: Multiplanar, multiecho pulse sequences of the brain and surrounding structures were obtained without intravenous contrast. Angiographic images of the Circle of Willis were obtained using MRA technique without intravenous contrast. Multiplanar, multi echo pulse sequences of the cervical spine were obtained without contrast. COMPARISON:  CT HEAD September 09, 2016 1420 hours FINDINGS: MRI HEAD FINDINGS BRAIN: Multiple sub cm foci of reduced diffusion in LEFT frontal and parietal cortices, LEFT periatrial white matter, and LEFT occipital lobe. Corresponding low ADC values in the larger foci. No susceptibility artifact to suggest hemorrhage. No susceptibility artifact to suggest hemorrhage. The ventricles and sulci are normal for patient's age. Patchy supratentorial white matter FLAIR T2 hyperintensities exclusive of the aforementioned abnormality compatible with mild chronic small vessel ischemic disease. No suspicious parenchymal signal, masses or mass effect. No abnormal extra-axial fluid collections. No extra-axial masses though, contrast enhanced sequences would be more sensitive. VASCULAR: Major dolicoectatic intracranial vascular flow voids present at skull base. SKULL AND UPPER CERVICAL SPINE: No abnormal sellar expansion. No suspicious calvarial bone marrow signal. Craniocervical junction maintained. SINUSES/ORBITS: Small RIGHT maxillary mucosal retention cyst. Mastoid air cells are well aerated. The included ocular globes and orbital contents are non-suspicious. OTHER: None.  MRA HEAD FINDINGS ANTERIOR CIRCULATION: Dolicoectatic flow related enhancement of the included cervical, petrous, cavernous and supraclinoid  internal carotid arteries. LEFT A1 segment developmentally dominant. Patent anterior communicating artery. Moderate to severe stenosis distal RIGHT M2 segment. Occluded LEFT M2 superior segment. Normal flow related enhancement of the anterior cerebral arteries, including distal segments. Mild luminal regularity. No aneurysm. POSTERIOR CIRCULATION: LEFT vertebral artery is dominant. Basilar artery is patent, with normal flow related enhancement of the main branch vessels. Robust RIGHT posterior communicating artery present. Flow related enhancement of the posterior cerebral arteries. Mild focal stenosis RIGHT P3 segment. Mild luminal irregularity. No large vessel occlusion, high-grade stenosis, aneurysm. MRI CERVICAL SPINE FINDINGS ALIGNMENT: Straightened cervical lordosis. Minimal grade 1 C4-5 anterolisthesis. VERTEBRAE/DISCS: Vertebral bodies are intact. Moderate C3-4 disc height loss, mild at C5-6. Decreased T2 signal within all disc compatible with desiccation. Moderate chronic discogenic endplate changes C5-6, mild at C3-4, C4-5 and C6-7. Bright STIR signal LEFT C6-7 facet. CORD:Cervical spinal cord is normal morphology and signal characteristics from the cervicomedullary junction to level of T1-2, the most caudal well visualized level. POSTERIOR FOSSA, VERTEBRAL ARTERIES, PARASPINAL TISSUES: No MR findings of ligamentous injury. Trace bright STIR signal about the LEFT C6-7 facet. Vertebral artery flow voids present. Included posterior fossa and paraspinal soft tissues are normal. DISC LEVELS: C2-3: Small broad-based central disc protrusion. Uncovertebral hypertrophy. Mild RIGHT facet arthropathy. No canal stenosis or neural foraminal narrowing. C3-4: Small broad-based disc bulge, uncovertebral hypertrophy. Moderate RIGHT, mild LEFT facet arthropathy. No canal stenosis. Severe RIGHT, moderate to severe LEFT neural foraminal narrowing. C4-5: Anterolisthesis. Annular bulging uncovertebral hypertrophy. Severe  RIGHT, mild LEFT facet arthropathy. No canal stenosis. Moderate RIGHT neural foraminal narrowing. C5-6: Small broad-based disc bulge, uncovertebral hypertrophy. Moderate facet arthropathy. No canal stenosis. Moderate RIGHT, severe LEFT neural foraminal narrowing. C6-7: Annular bulging, uncovertebral hypertrophy. Moderate bilateral facet arthropathy. No canal stenosis. Mild RIGHT neural foraminal narrowing. C7-T1: Small RIGHT central disc protrusion. Uncovertebral hypertrophy. Severe LEFT facet arthropathy, mild on the RIGHT. LEFT facet effusion is likely reactive. No canal stenosis or neural foraminal narrowing. 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 Mild chronic small vessel ischemic disease. MRA HEAD: Acute LEFT M2 segment occlusion. High-grade stenosis distal LEFT M1 segment suggesting thromboembolism. MRI CERVICAL SPINE: Straightened cervical lordosis, grade 1 C4-5 anterolisthesis on degenerative basis. LEFT C7-T1 facet edema and effusion, likely reactive. Degenerative cervical spine without canal stenosis. Multilevel neural foraminal narrowing: Severe on the RIGHT at C3-4, severe on the LEFT at C5-6. Acute findings discussed with and reconfirmed by Dr.Knott on 09/09/2016 at 6:52 pm. Electronically Signed   By: Awilda Metro M.D.   On: 09/09/2016 18:53   Mr Cervical Spine Wo Contrast  Result Date: 09/09/2016 CLINICAL DATA:  RIGHT hand numbness.  History of hypertension. EXAM: MRI HEAD WITHOUT CONTRAST MRA HEAD WITHOUT CONTRAST MRI CERVICAL SPINE WITHOUT CONTRAST TECHNIQUE: Multiplanar, multiecho pulse sequences of the brain and surrounding structures were obtained without intravenous contrast. Angiographic images of the Circle of Willis were obtained using MRA technique without intravenous contrast. Multiplanar, multi echo pulse sequences of the cervical spine were obtained without contrast. COMPARISON:  CT HEAD September 09, 2016 1420 hours FINDINGS: MRI HEAD FINDINGS BRAIN: Multiple sub cm foci of reduced diffusion in LEFT frontal and parietal cortices, LEFT periatrial white matter, and LEFT occipital lobe. Corresponding low ADC values in the larger foci. No susceptibility artifact to suggest hemorrhage. No susceptibility artifact to suggest hemorrhage. The ventricles  and sulci are normal for patient's age. Patchy supratentorial white matter FLAIR T2 hyperintensities exclusive of the aforementioned abnormality compatible with mild chronic small vessel ischemic disease. No suspicious parenchymal signal, masses or mass effect. No abnormal extra-axial fluid collections. No extra-axial masses though, contrast enhanced sequences would be more sensitive. VASCULAR: Major dolicoectatic intracranial vascular flow voids present at skull base. SKULL AND UPPER CERVICAL SPINE: No abnormal sellar expansion. No suspicious calvarial bone marrow signal. Craniocervical junction maintained. SINUSES/ORBITS: Small RIGHT maxillary mucosal retention cyst. Mastoid air cells are well aerated. The included ocular globes and orbital contents are non-suspicious. OTHER: None. MRA HEAD FINDINGS ANTERIOR CIRCULATION: Dolicoectatic flow related enhancement of the included cervical, petrous, cavernous and supraclinoid internal carotid arteries. LEFT A1 segment developmentally dominant. Patent anterior communicating artery. Moderate to severe stenosis distal RIGHT M2 segment. Occluded LEFT M2 superior segment. Normal flow related enhancement of the anterior cerebral arteries, including distal segments. Mild luminal regularity. No aneurysm. POSTERIOR CIRCULATION: LEFT vertebral artery is dominant. Basilar artery is patent, with normal flow related enhancement of the main branch vessels. Robust RIGHT posterior communicating artery present. Flow related enhancement of the posterior cerebral arteries. Mild focal stenosis RIGHT P3 segment. Mild luminal irregularity. No  large vessel occlusion, high-grade stenosis, aneurysm. MRI CERVICAL SPINE FINDINGS ALIGNMENT: Straightened cervical lordosis. Minimal grade 1 C4-5 anterolisthesis. VERTEBRAE/DISCS: Vertebral bodies are intact. Moderate C3-4 disc height loss, mild at C5-6. Decreased T2 signal within all disc compatible with desiccation. Moderate chronic discogenic endplate changes C5-6, mild at C3-4, C4-5 and C6-7. Bright STIR signal LEFT C6-7 facet. CORD:Cervical spinal cord is normal morphology and signal characteristics from the cervicomedullary junction to level of T1-2, the most caudal well visualized level. POSTERIOR FOSSA, VERTEBRAL ARTERIES, PARASPINAL TISSUES: No MR findings of ligamentous injury. Trace bright STIR signal about the LEFT C6-7 facet. Vertebral artery flow voids present. Included posterior fossa and paraspinal soft tissues are normal. DISC LEVELS: C2-3: Small broad-based central disc protrusion. Uncovertebral hypertrophy. Mild RIGHT facet arthropathy. No canal stenosis or neural foraminal narrowing. C3-4: Small broad-based disc bulge, uncovertebral hypertrophy. Moderate RIGHT, mild LEFT facet arthropathy. No canal stenosis. Severe RIGHT, moderate to severe LEFT neural foraminal narrowing. C4-5: Anterolisthesis. Annular bulging uncovertebral hypertrophy. Severe RIGHT, mild LEFT facet arthropathy. No canal stenosis. Moderate RIGHT neural foraminal narrowing. C5-6: Small broad-based disc bulge, uncovertebral hypertrophy. Moderate facet arthropathy. No canal stenosis. Moderate RIGHT, severe LEFT neural foraminal narrowing. C6-7: Annular bulging, uncovertebral hypertrophy. Moderate bilateral facet arthropathy. No canal stenosis. Mild RIGHT neural foraminal narrowing. C7-T1: Small RIGHT central disc protrusion. Uncovertebral hypertrophy. Severe LEFT facet arthropathy, mild on the RIGHT. LEFT facet effusion is likely reactive. No canal stenosis or neural foraminal narrowing. IMPRESSION: MRI HEAD: Multiple acute sub cm  LEFT MCA territory and left watershed infarcts consistent with thromboembolism. Mild chronic small vessel ischemic disease. MRA HEAD: Acute LEFT M2 segment occlusion. High-grade stenosis distal LEFT M1 segment suggesting thromboembolism. MRI CERVICAL SPINE: Straightened cervical lordosis, grade 1 C4-5 anterolisthesis on degenerative basis. LEFT C7-T1 facet edema and effusion, likely reactive. Degenerative cervical spine without canal stenosis. Multilevel neural foraminal narrowing: Severe on the RIGHT at C3-4, severe on the LEFT at C5-6. Acute findings discussed with and reconfirmed by Dr.Knott on 09/09/2016 at 6:52 pm. Electronically Signed   By: Awilda Metro M.D.   On: 09/09/2016 18:53   Mr Maxine Glenn Head/brain ZO Cm  Result Date: 09/09/2016 CLINICAL DATA:  RIGHT hand numbness.  History of hypertension. EXAM: MRI HEAD WITHOUT CONTRAST MRA HEAD WITHOUT CONTRAST MRI CERVICAL SPINE  WITHOUT CONTRAST TECHNIQUE: Multiplanar, multiecho pulse sequences of the brain and surrounding structures were obtained without intravenous contrast. Angiographic images of the Circle of Willis were obtained using MRA technique without intravenous contrast. Multiplanar, multi echo pulse sequences of the cervical spine were obtained without contrast. COMPARISON:  CT HEAD September 09, 2016 1420 hours FINDINGS: MRI HEAD FINDINGS BRAIN: Multiple sub cm foci of reduced diffusion in LEFT frontal and parietal cortices, LEFT periatrial white matter, and LEFT occipital lobe. Corresponding low ADC values in the larger foci. No susceptibility artifact to suggest hemorrhage. No susceptibility artifact to suggest hemorrhage. The ventricles and sulci are normal for patient's age. Patchy supratentorial white matter FLAIR T2 hyperintensities exclusive of the aforementioned abnormality compatible with mild chronic small vessel ischemic disease. No suspicious parenchymal signal, masses or mass effect. No abnormal extra-axial fluid collections. No  extra-axial masses though, contrast enhanced sequences would be more sensitive. VASCULAR: Major dolicoectatic intracranial vascular flow voids present at skull base. SKULL AND UPPER CERVICAL SPINE: No abnormal sellar expansion. No suspicious calvarial bone marrow signal. Craniocervical junction maintained. SINUSES/ORBITS: Small RIGHT maxillary mucosal retention cyst. Mastoid air cells are well aerated. The included ocular globes and orbital contents are non-suspicious. OTHER: None. MRA HEAD FINDINGS ANTERIOR CIRCULATION: Dolicoectatic flow related enhancement of the included cervical, petrous, cavernous and supraclinoid internal carotid arteries. LEFT A1 segment developmentally dominant. Patent anterior communicating artery. Moderate to severe stenosis distal RIGHT M2 segment. Occluded LEFT M2 superior segment. Normal flow related enhancement of the anterior cerebral arteries, including distal segments. Mild luminal regularity. No aneurysm. POSTERIOR CIRCULATION: LEFT vertebral artery is dominant. Basilar artery is patent, with normal flow related enhancement of the main branch vessels. Robust RIGHT posterior communicating artery present. Flow related enhancement of the posterior cerebral arteries. Mild focal stenosis RIGHT P3 segment. Mild luminal irregularity. No large vessel occlusion, high-grade stenosis, aneurysm. MRI CERVICAL SPINE FINDINGS ALIGNMENT: Straightened cervical lordosis. Minimal grade 1 C4-5 anterolisthesis. VERTEBRAE/DISCS: Vertebral bodies are intact. Moderate C3-4 disc height loss, mild at C5-6. Decreased T2 signal within all disc compatible with desiccation. Moderate chronic discogenic endplate changes C5-6, mild at C3-4, C4-5 and C6-7. Bright STIR signal LEFT C6-7 facet. CORD:Cervical spinal cord is normal morphology and signal characteristics from the cervicomedullary junction to level of T1-2, the most caudal well visualized level. POSTERIOR FOSSA, VERTEBRAL ARTERIES, PARASPINAL TISSUES: No  MR findings of ligamentous injury. Trace bright STIR signal about the LEFT C6-7 facet. Vertebral artery flow voids present. Included posterior fossa and paraspinal soft tissues are normal. DISC LEVELS: C2-3: Small broad-based central disc protrusion. Uncovertebral hypertrophy. Mild RIGHT facet arthropathy. No canal stenosis or neural foraminal narrowing. C3-4: Small broad-based disc bulge, uncovertebral hypertrophy. Moderate RIGHT, mild LEFT facet arthropathy. No canal stenosis. Severe RIGHT, moderate to severe LEFT neural foraminal narrowing. C4-5: Anterolisthesis. Annular bulging uncovertebral hypertrophy. Severe RIGHT, mild LEFT facet arthropathy. No canal stenosis. Moderate RIGHT neural foraminal narrowing. C5-6: Small broad-based disc bulge, uncovertebral hypertrophy. Moderate facet arthropathy. No canal stenosis. Moderate RIGHT, severe LEFT neural foraminal narrowing. C6-7: Annular bulging, uncovertebral hypertrophy. Moderate bilateral facet arthropathy. No canal stenosis. Mild RIGHT neural foraminal narrowing. C7-T1: Small RIGHT central disc protrusion. Uncovertebral hypertrophy. Severe LEFT facet arthropathy, mild on the RIGHT. LEFT facet effusion is likely reactive. No canal stenosis or neural foraminal narrowing. 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 Mild chronic small vessel ischemic disease. MRA HEAD: Acute LEFT M2 segment occlusion. High-grade stenosis distal LEFT M1 segment suggesting thromboembolism.  MRI CERVICAL SPINE: Straightened cervical lordosis, grade 1 C4-5 anterolisthesis on degenerative basis. LEFT C7-T1 facet edema and effusion, likely reactive. Degenerative cervical spine without canal stenosis. Multilevel neural foraminal narrowing: Severe on the RIGHT at C3-4, severe on the LEFT at C5-6. Acute findings discussed with and reconfirmed by Dr.Knott on 09/09/2016 at 6:52 pm. Electronically Signed   By:  Awilda Metro M.D.   On: 09/09/2016 18:53    Time Spent in minutes  30   Tanecia Mccay K M.D on 09/10/2016 at 10:14 AM  Between 7am to 7pm - Pager - [phone]  After 7pm go to [email or web address] - password [facility]  [facility] -  Office  [phone]

## 2016-09-10 NOTE — Progress Notes (Signed)
SLP Cancellation Note  Patient Details Name: Audie PintoStephen Slager MRN: [id number] DOB: 1946-01-17   Cancelled treatment:       Reason Eval/Treat Not Completed: Patient at procedure or test/unavailable   Chela Sutphen, Riley NearingBonnie Caroline 09/10/2016, 9:55 AM

## 2016-09-11 LAB — VAS US CAROTID
LCCADDIAS: 18 cm/s
LCCADSYS: 97 cm/s
LCCAPSYS: 138 cm/s
LEFT ECA DIAS: -11 cm/s
LEFT VERTEBRAL DIAS: 12 cm/s
LICADSYS: -70 cm/s
Left CCA prox dias: 21 cm/s
Left ICA dist dias: -18 cm/s
Left ICA prox dias: -16 cm/s
Left ICA prox sys: -61 cm/s
RCCADSYS: -82 cm/s
RCCAPDIAS: 17 cm/s
RCCAPSYS: 147 cm/s
RIGHT ECA DIAS: -15 cm/s
RIGHT VERTEBRAL DIAS: 11 cm/s

## 2016-09-11 LAB — HEMOGLOBIN A1C
Hgb A1c MFr Bld: 5.3 % (ref 4.8–5.6)
MEAN PLASMA GLUCOSE: 105 mg/dL

## 2016-09-16 ENCOUNTER — Encounter: Payer: Self-pay | Admitting: Nurse Practitioner

## 2016-09-16 ENCOUNTER — Ambulatory Visit (INDEPENDENT_AMBULATORY_CARE_PROVIDER_SITE_OTHER): Payer: Self-pay | Admitting: Nurse Practitioner

## 2016-09-16 VITALS — BP 130/70 | HR 66 | Ht 75.0 in | Wt 228.0 lb

## 2016-09-16 DIAGNOSIS — I10 Essential (primary) hypertension: Secondary | ICD-10-CM

## 2016-09-16 DIAGNOSIS — I480 Paroxysmal atrial fibrillation: Secondary | ICD-10-CM

## 2016-09-16 NOTE — Progress Notes (Signed)
Electrophysiology Office Note Date: 09/17/2016  ID:  Tyler Ruiz, DOB 21-Feb-1946, MRN [id number]  PCP: Everlean Cherry, MD Primary Cardiologist: Sampson Goon [facility])  Reason for consult: Evaluate for ILR implant  Tyler Ruiz is a 70 y.o. male seen today at the request of Dr Eden Emms to consider ILR implant to look for AF burden.  He was recently admitted with TIA symptoms.  He was started on Xarelto and has had no bleeding complications.  He was previously on amiodarone but was recently placed on Flecainide. He is asymptomatic from his AF and did not feel any difference with prior cardioversions.  He has been maintaining SR by recent EKG's.  He is still followed closely by Dr Sampson Goon.  He denies chest pain, palpitations, dyspnea, PND, orthopnea, nausea, vomiting, dizziness, syncope, edema, weight gain, or early satiety.  Past Medical History:  Diagnosis Date  . Atrial fibrillation (HCC)   . Dysrhythmia approx 2014   afib  . Hypertension   . Night muscle spasms    lower back and flank area  . Stroke [facility])    newly dx. 09/09/16   Past Surgical History:  Procedure Laterality Date  . CARDIOVERSION     x 2  . dental implant x 1 tooth    . DENTAL SURGERY    . nondisplaced right shoulder fx    . SKIN CANCER EXCISION    . skin cancer removal     from back - no further treatment    Current Outpatient Prescriptions  Medication Sig Dispense Refill  . amLODipine (NORVASC) 10 MG tablet Take 0.5 tablet by mouth daily    . atorvastatin (LIPITOR) 40 MG tablet Take 1 tablet (40 mg total) by mouth daily at 6 PM. 30 tablet 0  . cholecalciferol (VITAMIN D) 1000 units tablet Take 1,000 Units by mouth every other day.     . docusate sodium (COLACE) 100 MG capsule Take 100-200 mg by mouth daily as needed for mild constipation.    . flecainide (TAMBOCOR) 100 MG tablet Take 100 mg by mouth 2 (two) times daily.    Marland Kitchen lisinopril (PRINIVIL,ZESTRIL) 40 MG tablet Take 0.5 tablets (20 mg  total) by mouth daily.    . Multiple Vitamin (MULTIVITAMIN WITH MINERALS) TABS tablet Take 1 tablet by mouth daily.    . Omega-3 Fatty Acids (FISH OIL) 1000 MG CAPS Take 1,000 mg by mouth at bedtime.     . rivaroxaban (XARELTO) 20 MG TABS tablet Take 1 tablet (20 mg total) by mouth daily with supper. 30 tablet 0   No current facility-administered medications for this visit.     Allergies:   Review of patient's allergies indicates no known allergies.   Social History: Social History   Social History  . Marital status: Married    Spouse name: N/A  . Number of children: N/A  . Years of education: N/A   Occupational History  . Not on file.   Social History Main Topics  . Smoking status: Never Smoker  . Smokeless tobacco: Never Used  . Alcohol use Yes     Comment: approx. 1 beer every week  . Drug use: No  . Sexual activity: No   Other Topics Concern  . Not on file   Social History Narrative  . No narrative on file    Family History: Family History  Problem Relation Age of Onset  . Stroke Mother     in age 90's  . AAA (abdominal aortic aneurysm) Father  Review of Systems: All other systems reviewed and are otherwise negative except as noted above.   Physical Exam: VS:  BP 130/70   Pulse 66   Ht 6\' 3"  (1.905 m)   Wt 228 lb (103.4 kg)   SpO2 97%   BMI 28.50 kg/m  , BMI Body mass index is 28.5 kg/m. Wt Readings from Last 3 Encounters:  09/16/16 228 lb (103.4 kg)  09/10/16 227 lb 8 oz (103.2 kg)  03/08/12 225 lb (102.1 kg)    GEN- The patient is well appearing, alert and oriented x 3 today.   HEENT: normocephalic, atraumatic; sclera clear, conjunctiva pink; hearing intact; oropharynx clear; neck supple Lungs- Clear to ausculation bilaterally, normal work of breathing.  No wheezes, rales, rhonchi Heart- Regular rate and rhythm GI- soft, non-tender, non-distended, bowel sounds present Extremities- no clubbing, cyanosis, or edema MS- no significant  deformity or atrophy Skin- warm and dry, no rash or lesion  Psych- euthymic mood, full affect Neuro- strength and sensation are intact   EKG:  EKG is not ordered today.  Recent Labs: 09/09/2016: BUN 16; Creatinine, Ser 1.22; Hemoglobin 15.0; Platelets 190; Potassium 4.7; Sodium 140 09/10/2016: ALT 14    Other studies Reviewed: Additional studies/ records that were reviewed today include: hospital records   Assessment and Plan: 1.  Paroxysmal atrial fibrillation Asymtpomatic Continue Flecainide as per Dr Sampson GoonFitzgerald.  EKG from recent hospitalization with normal QRS and PR interval Continue Xarelto long term for CHADS2VASC of 4  2.  Recent TIA Recovering well Continue long term anticoagulation  3.  HTN Stable No change required today   We discussed the rationale behind ILR implant today to look for AF as a cause for TIA/stroke in patients without known AF or to evaluate AF burden and help manage medications. As he has done well on Flecanide, would not recommend ILR implant at this time. Follow up with Dr Sampson GoonFitzgerald as scheduled.  We have discussed the use of AliveCor monitor to look for AF intermittently as he is asymptomatic.    Current medicines are reviewed at length with the patient today.   The patient does not have concerns regarding his medicines.  The following changes were made today:  none  Labs/ tests ordered today include: none No orders of the defined types were placed in this encounter.    Disposition:   Follow up with EP prn    Signed, Gypsy BalsamAmber Elianis Fischbach, NP 09/17/2016 8:45 AM   [facility] [address] [place] [address] [phone] (office) [phone] (fax

## 2016-09-16 NOTE — Patient Instructions (Addendum)
Medication Instructions:   NONE ORDER TODAY   If you need a refill on your cardiac medications before your next appointment, please call your pharmacy.  Labwork: NONE ORDER TODAY    Testing/Procedures: NONE ORDER TODAY    Follow-Up: ALIV E CORE   CONTACT [facility] [phone] AS NEEDED FOR  ANY CARDIAC RELATED SYMPTOMS    Any Other Special Instructions Will Be Listed Below (If Applicable).

## 2016-10-06 ENCOUNTER — Other Ambulatory Visit (HOSPITAL_COMMUNITY): Payer: Self-pay | Admitting: Internal Medicine

## 2017-04-28 IMAGING — MR MR CERVICAL SPINE W/O CM
4 of 5 series · 16 of 48 positions shown · non-contrast
Comparison: CT HEAD [DATE], [id number] hours

CLINICAL DATA: RIGHT hand numbness.  History of hypertension.

EXAM:
MRI HEAD WITHOUT CONTRAST
MRA HEAD WITHOUT CONTRAST
MRI CERVICAL SPINE WITHOUT CONTRAST
TECHNIQUE: Multiplanar, multiecho pulse sequences of the brain and surrounding
structures were obtained without intravenous contrast. Angiographic
images of the Circle of Willis were obtained using MRA technique
without intravenous contrast. Multiplanar, multi echo pulse
sequences of the cervical spine were obtained without contrast.

[Series 2: T2 post-contrast · sagittal · 3.0mm · 0.41mm/px · 6 of 15 slices shown]
[im 1/15]
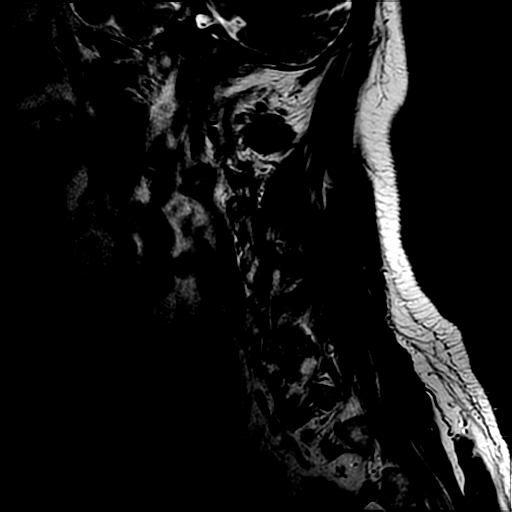
[im 3/15]
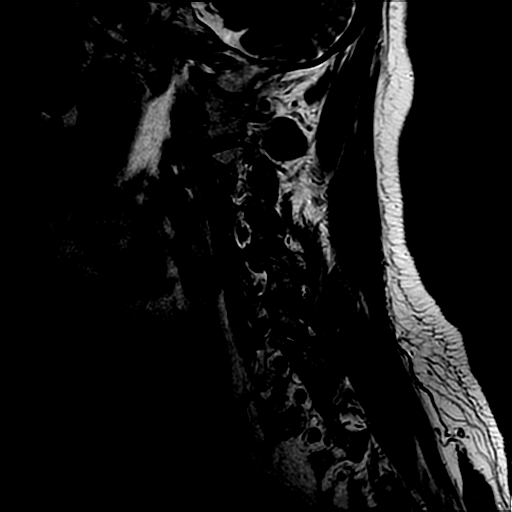
[im 6/15]
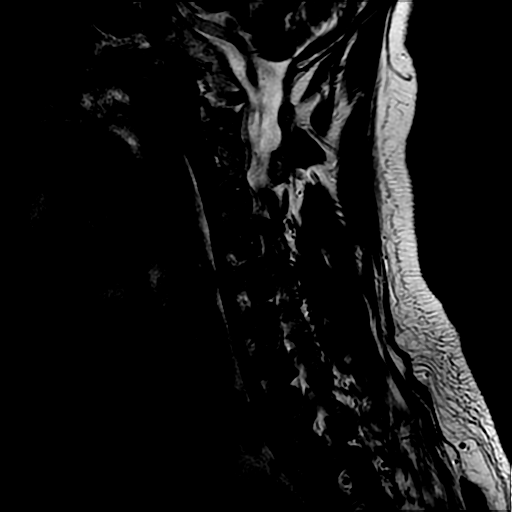
[im 9/15]
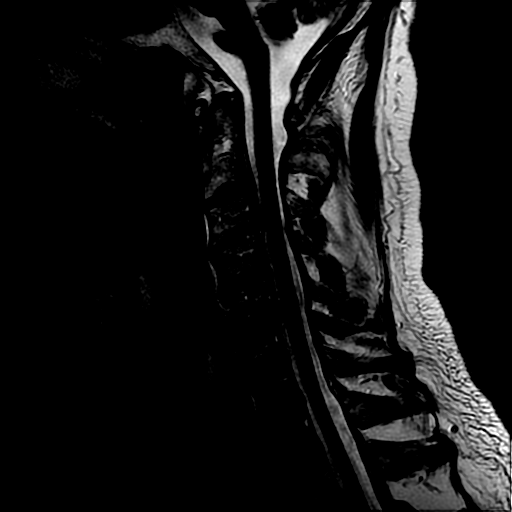
[im 12/15]
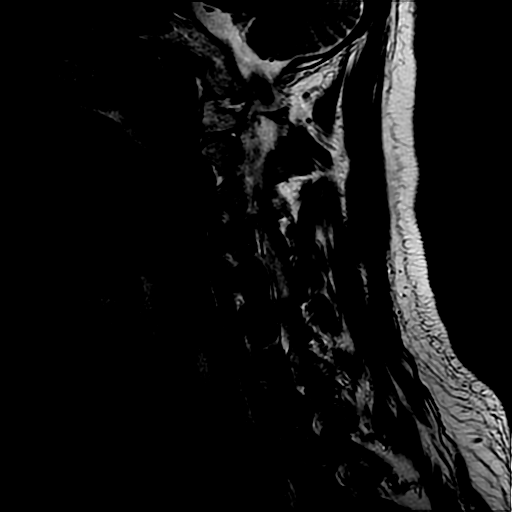
[im 15/15]
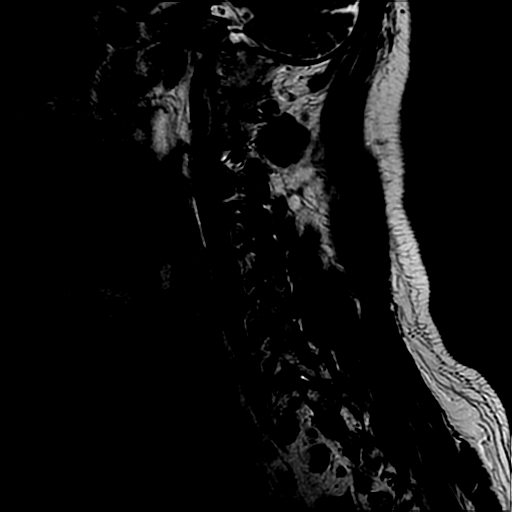

[Series 3: T1 · sagittal · 3.0mm · 0.41mm/px · 3 of 15 slices shown]
[im 3/15]
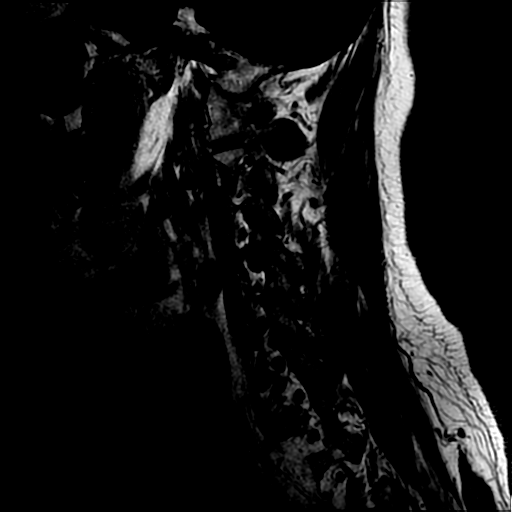
[im 8/15]
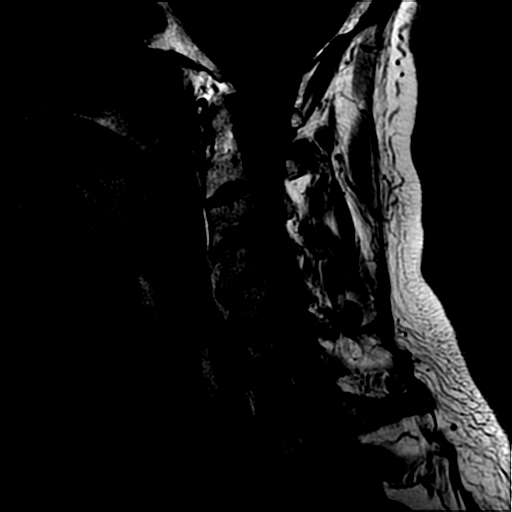
[im 12/15]
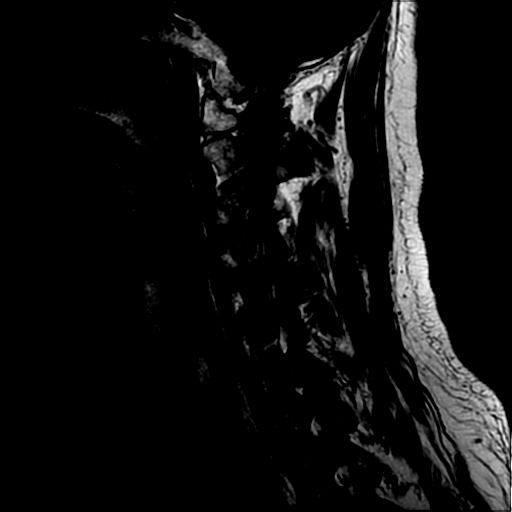

[Series 4: sag ir · sagittal · 3.0mm · 0.41mm/px · 3 of 15 slices shown]
[im 3/15]
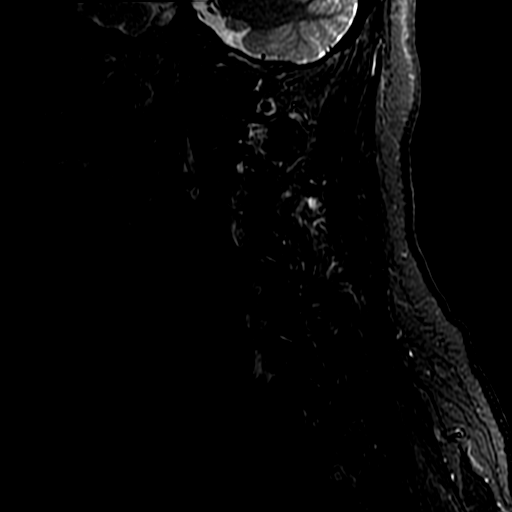
[im 8/15]
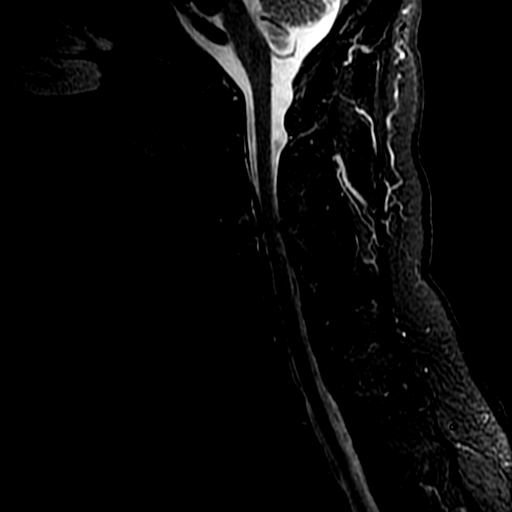
[im 12/15]
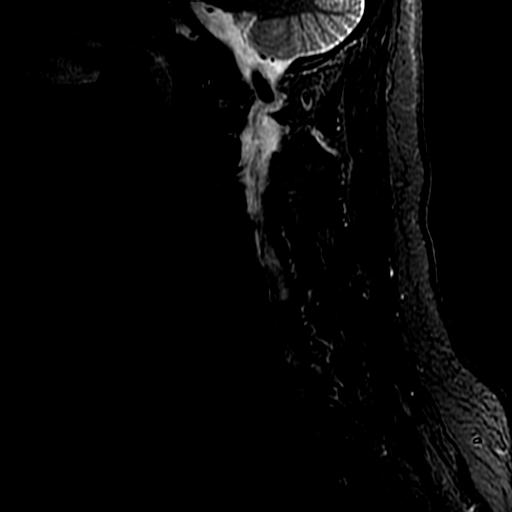

[Series 6: T2 · axial · 3.1mm · 0.35mm/px · z∈[-28,+58]mm · 4 of 31 slices shown]
[im 1/31]
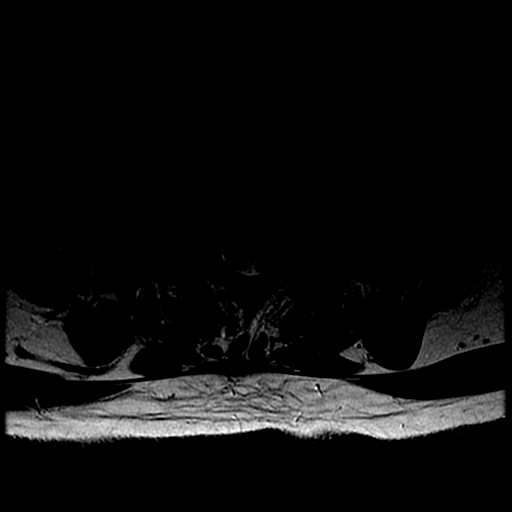
[im 5/31]
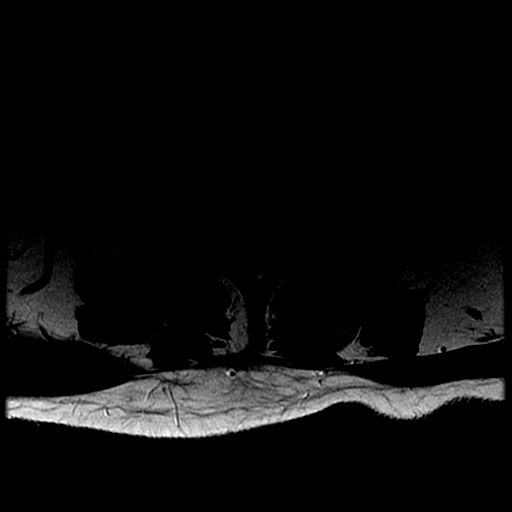
[im 17/31]
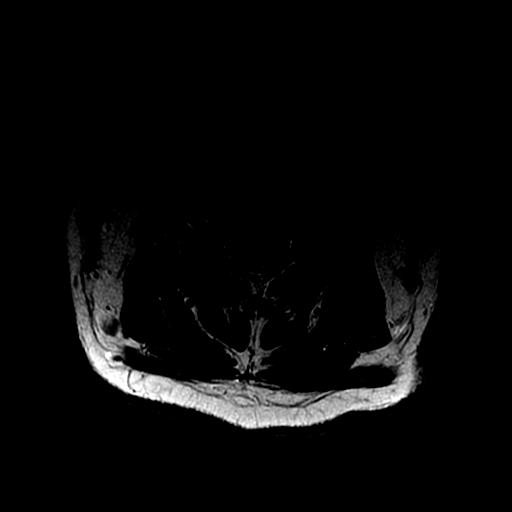
[im 26/31]
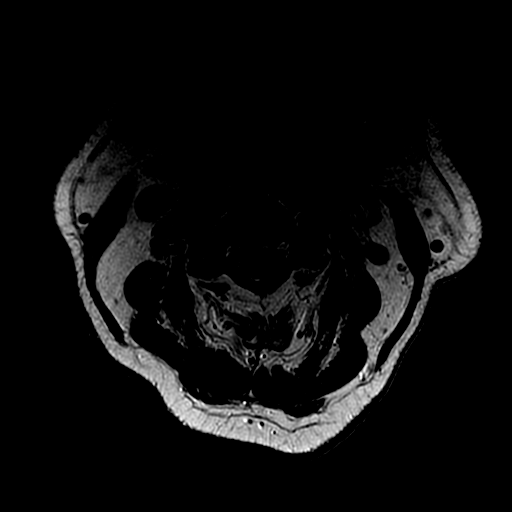

[16 of 48 positions shown; findings below may reference images not displayed]

FINDINGS: MRI HEAD FINDINGS

BRAIN: Multiple sub cm foci of reduced diffusion in LEFT frontal and
parietal cortices, LEFT periatrial white matter, and LEFT occipital
lobe. Corresponding low ADC values in the larger foci. No
susceptibility artifact to suggest hemorrhage. No susceptibility
artifact to suggest hemorrhage. The ventricles and sulci are normal
for patient's age. Patchy supratentorial white matter FLAIR T2
hyperintensities exclusive of the aforementioned abnormality
compatible with mild chronic small vessel ischemic disease. No
suspicious parenchymal signal, masses or mass effect. No abnormal
extra-axial fluid collections. No extra-axial masses though,
contrast enhanced sequences would be more sensitive.

VASCULAR: Major dolicoectatic intracranial vascular flow voids
present at skull base.

SKULL AND UPPER CERVICAL SPINE: No abnormal sellar expansion. No
suspicious calvarial bone marrow signal. Craniocervical junction
maintained.

SINUSES/ORBITS: Small RIGHT maxillary mucosal retention cyst.
Mastoid air cells are well aerated. The included ocular globes and
orbital contents are non-suspicious.

OTHER: None.

MRA HEAD FINDINGS

ANTERIOR CIRCULATION: Dolicoectatic flow related enhancement of the
included cervical, petrous, cavernous and supraclinoid internal
carotid arteries. LEFT A1 segment developmentally dominant. Patent
anterior communicating artery. Moderate to severe stenosis distal
RIGHT M2 segment. Occluded LEFT M2 superior segment. Normal flow
related enhancement of the anterior cerebral arteries, including
distal segments. Mild luminal regularity.

No aneurysm.

POSTERIOR CIRCULATION: LEFT vertebral artery is dominant. Basilar
artery is patent, with normal flow related enhancement of the main
branch vessels. Robust RIGHT posterior communicating artery present.
Flow related enhancement of the posterior cerebral arteries. Mild
focal stenosis RIGHT P3 segment. Mild luminal irregularity.

No large vessel occlusion, high-grade stenosis, aneurysm.

MRI CERVICAL SPINE FINDINGS

ALIGNMENT: Straightened cervical lordosis. Minimal grade 1 C4-5
anterolisthesis.

VERTEBRAE/DISCS: Vertebral bodies are intact. Moderate C3-4 disc
height loss, mild at C5-6. Decreased T2 signal within all disc
compatible with desiccation. Moderate chronic discogenic endplate
changes C5-6, mild at C3-4, C4-5 and C6-7. Bright STIR signal LEFT
C6-7 facet.

CORD:Cervical spinal cord is normal morphology and signal
characteristics from the cervicomedullary junction to level of T1-2,
the most caudal well visualized level.

POSTERIOR FOSSA, VERTEBRAL ARTERIES, PARASPINAL TISSUES: No MR
findings of ligamentous injury. Trace bright STIR signal about the
LEFT C6-7 facet. Vertebral artery flow voids present. Included
posterior fossa and paraspinal soft tissues are normal.

DISC LEVELS:

C2-3: Small broad-based central disc protrusion. Uncovertebral
hypertrophy. Mild RIGHT facet arthropathy. No canal stenosis or
neural foraminal narrowing.

C3-4: Small broad-based disc bulge, uncovertebral hypertrophy.
Moderate RIGHT, mild LEFT facet arthropathy. No canal stenosis.
Severe RIGHT, moderate to severe LEFT neural foraminal narrowing.

C4-5: Anterolisthesis. Annular bulging uncovertebral hypertrophy.
Severe RIGHT, mild LEFT facet arthropathy. No canal stenosis.
Moderate RIGHT neural foraminal narrowing.

C5-6: Small broad-based disc bulge, uncovertebral hypertrophy.
Moderate facet arthropathy. No canal stenosis. Moderate RIGHT,
severe LEFT neural foraminal narrowing.

C6-7: Annular bulging, uncovertebral hypertrophy. Moderate bilateral
facet arthropathy. No canal stenosis. Mild RIGHT neural foraminal
narrowing.

C7-T1: Small RIGHT central disc protrusion. Uncovertebral
hypertrophy. Severe LEFT facet arthropathy, mild on the RIGHT. LEFT
facet effusion is likely reactive. No canal stenosis or neural
foraminal narrowing.
IMPRESSION: MRI HEAD: Multiple acute sub cm LEFT MCA territory and left
watershed infarcts consistent with thromboembolism.

Mild chronic small vessel ischemic disease.

MRA HEAD: Acute LEFT M2 segment occlusion. High-grade stenosis
distal LEFT M1 segment suggesting thromboembolism.

MRI CERVICAL SPINE: Straightened cervical lordosis, grade 1 C4-5
anterolisthesis on degenerative basis.

LEFT C7-T1 facet edema and effusion, likely reactive.

Degenerative cervical spine without canal stenosis. Multilevel
neural foraminal narrowing: Severe on the RIGHT at C3-4, severe on
the LEFT at C5-6.

Acute findings discussed with and reconfirmed by Dr.J-Philippe on
09/09/2016 at [DATE].

## 2017-11-01 ENCOUNTER — Emergency Department (HOSPITAL_COMMUNITY)
Admission: EM | Admit: 2017-11-01 | Discharge: 2017-11-01 | Disposition: A | Discharge status: Home / Self Care | Payer: Medicare HMO | Attending: Emergency Medicine | Admitting: Emergency Medicine

## 2017-11-01 ENCOUNTER — Encounter (HOSPITAL_COMMUNITY): Payer: Self-pay | Admitting: Emergency Medicine

## 2017-11-01 DIAGNOSIS — I10 Essential (primary) hypertension: Secondary | ICD-10-CM | POA: Diagnosis not present

## 2017-11-01 DIAGNOSIS — R319 Hematuria, unspecified: Secondary | ICD-10-CM | POA: Insufficient documentation

## 2017-11-01 DIAGNOSIS — Z8673 Personal history of transient ischemic attack (TIA), and cerebral infarction without residual deficits: Secondary | ICD-10-CM | POA: Diagnosis not present

## 2017-11-01 DIAGNOSIS — Z85828 Personal history of other malignant neoplasm of skin: Secondary | ICD-10-CM | POA: Insufficient documentation

## 2017-11-01 DIAGNOSIS — Z79899 Other long term (current) drug therapy: Secondary | ICD-10-CM | POA: Diagnosis not present

## 2017-11-01 DIAGNOSIS — Z7901 Long term (current) use of anticoagulants: Secondary | ICD-10-CM | POA: Diagnosis not present

## 2017-11-01 LAB — URINALYSIS, MICROSCOPIC (REFLEX): Squamous Epithelial / LPF: NONE SEEN

## 2017-11-01 LAB — BASIC METABOLIC PANEL
Anion gap: 11 (ref 5–15)
BUN: 15 mg/dL (ref 6–20)
CALCIUM: 9 mg/dL (ref 8.9–10.3)
CO2: 21 mmol/L — AB (ref 22–32)
CREATININE: 1.04 mg/dL (ref 0.61–1.24)
Chloride: 104 mmol/L (ref 101–111)
GFR calc Af Amer: >60 mL/min (ref >60)
GFR calc non Af Amer: >60 mL/min (ref >60)
GLUCOSE: 96 mg/dL (ref 65–99)
Potassium: 3.6 mmol/L (ref 3.5–5.1)
Sodium: 136 mmol/L (ref 135–145)

## 2017-11-01 LAB — URINALYSIS, ROUTINE W REFLEX MICROSCOPIC
BILIRUBIN URINE: NEGATIVE
GLUCOSE, UA: NEGATIVE mg/dL
KETONES UR: 15 mg/dL — AB
Nitrite: POSITIVE — AB
PH: 5.5 (ref 5.0–8.0)
Protein, ur: 100 mg/dL — AB
Specific Gravity, Urine: 1.01 (ref 1.005–1.030)

## 2017-11-01 LAB — CBC
HCT: 42.2 % (ref 39.0–52.0)
Hemoglobin: 14.5 g/dL (ref 13.0–17.0)
MCH: 29.7 pg (ref 26.0–34.0)
MCHC: 34.4 g/dL (ref 30.0–36.0)
MCV: 86.5 fL (ref 78.0–100.0)
PLATELETS: 167 10*3/uL (ref 150–400)
RBC: 4.88 MIL/uL (ref 4.22–5.81)
RDW: 13.3 % (ref 11.5–15.5)
WBC: 8.1 10*3/uL (ref 4.0–10.5)

## 2017-11-01 MED ORDER — CEPHALEXIN 500 MG PO CAPS
500.0000 mg | ORAL_CAPSULE | Freq: Four times a day (QID) | ORAL | 0 refills | Status: DC
Start: 2017-11-01 — End: 2022-03-10
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
## 2022-03-10 ENCOUNTER — Other Ambulatory Visit: Payer: Self-pay

## 2022-03-10 ENCOUNTER — Emergency Department (HOSPITAL_COMMUNITY)
Admission: EM | Admit: 2022-03-10 | Discharge: 2022-03-10 | Disposition: A | Discharge status: Home / Self Care | Payer: Medicare HMO | Attending: Emergency Medicine | Admitting: Emergency Medicine

## 2022-03-10 ENCOUNTER — Encounter (HOSPITAL_COMMUNITY): Payer: Self-pay | Admitting: Emergency Medicine

## 2022-03-10 ENCOUNTER — Emergency Department (HOSPITAL_COMMUNITY): Payer: Medicare HMO

## 2022-03-10 DIAGNOSIS — Z7901 Long term (current) use of anticoagulants: Secondary | ICD-10-CM | POA: Diagnosis not present

## 2022-03-10 DIAGNOSIS — R310 Gross hematuria: Secondary | ICD-10-CM | POA: Insufficient documentation

## 2022-03-10 DIAGNOSIS — Z79899 Other long term (current) drug therapy: Secondary | ICD-10-CM | POA: Insufficient documentation

## 2022-03-10 DIAGNOSIS — R319 Hematuria, unspecified: Secondary | ICD-10-CM | POA: Diagnosis present

## 2022-03-10 DIAGNOSIS — R8289 Other abnormal findings on cytological and histological examination of urine: Secondary | ICD-10-CM | POA: Diagnosis not present

## 2022-03-10 LAB — BASIC METABOLIC PANEL
Anion gap: 11 (ref 5–15)
BUN: 11 mg/dL (ref 8–23)
CO2: 27 mmol/L (ref 22–32)
Calcium: 9.8 mg/dL (ref 8.9–10.3)
Chloride: 105 mmol/L (ref 98–111)
Creatinine, Ser: 1.13 mg/dL (ref 0.61–1.24)
GFR, Estimated: >60 mL/min (ref >60)
Glucose, Bld: 123 mg/dL — ABNORMAL HIGH (ref 70–99)
Potassium: 4.1 mmol/L (ref 3.5–5.1)
Sodium: 143 mmol/L (ref 135–145)

## 2022-03-10 LAB — CBC
HCT: 46.3 % (ref 39.0–52.0)
Hemoglobin: 15.5 g/dL (ref 13.0–17.0)
MCH: 29.5 pg (ref 26.0–34.0)
MCHC: 33.5 g/dL (ref 30.0–36.0)
MCV: 88.2 fL (ref 80.0–100.0)
Platelets: 197 10*3/uL (ref 150–400)
RBC: 5.25 MIL/uL (ref 4.22–5.81)
RDW: 12.9 % (ref 11.5–15.5)
WBC: 9.1 10*3/uL (ref 4.0–10.5)
nRBC: 0 % (ref 0.0–0.2)

## 2022-03-10 LAB — URINALYSIS, ROUTINE W REFLEX MICROSCOPIC

## 2022-03-10 LAB — URINALYSIS, MICROSCOPIC (REFLEX): RBC / HPF: >50 RBC/hpf (ref 0–5)

## 2022-03-10 NOTE — ED Triage Notes (Signed)
Patient here with complaint of hematuria starting at approximately 1500 yesterday. Patients states he collected samples through the day and has brought them with him for testing if needed. Patient feels this morning like there is something block the passage of urine out of his urethra now which is now complaining of pain with urination.  ?

## 2022-03-10 NOTE — ED Notes (Signed)
Called lab to see if there is a stone in pt's urine sample, per MD. Pt reports he passed something that hit straight to the bottom of the urine specimen cup. Urine specimen sent back to this RN to strain urine to look for a stone. No stone noted, just blood clots. Notified EDP. ?

## 2022-03-10 NOTE — ED Provider Notes (Signed)
?[facility] ?Provider Note ? ? ?CSN: [id number] ?Arrival date & time: 03/10/22  0747 ? ?  ? ?History ? ?Chief Complaint  ?Patient presents with  ? Hematuria  ? ? ?Tyler Ruiz is a 76 y.o. male. ? ?HPI ?76 year old male presents with hematuria.  Started yesterday afternoon around 3 PM.  He has a history of paroxysmal A-fib and is on Xarelto.  No pain until this morning when he got here.  He has been saving urinary samples as 1 time he saw something in one of the cups he urinated into.  He seen a couple blood clots.  He seems to be urinating normally without obstruction.  This morning when arriving in the emergency department and giving a sample he had a hard time going to the bathroom.  He was having pain in his penis.  Eventually something popped out and landed on the bottom of the toilet and he wonders if this was a stone.  The difficulty urinating and pain resolved.  No abdominal or flank pain recently or today. ? ?Home Medications ?Prior to Admission medications   ?Medication Sig Start Date End Date Taking? Authorizing Provider  ?amLODipine (NORVASC) 5 MG tablet Take 5 mg by mouth daily. 02/26/22  Yes [provider]  ?atorvastatin (LIPITOR) 40 MG tablet Take 1 tablet (40 mg total) by mouth daily at 6 PM. ?Patient taking differently: Take 40 mg by mouth daily. 09/10/16  Yes Thurnell Lose, MD  ?Cholecalciferol (VITAMIN D3) 125 MCG (5000 UT) TABS Take 1 tablet by mouth daily.   Yes [provider]  ?Cyanocobalamin 2500 MCG CHEW Chew 2,500 mcg by mouth daily.   Yes [provider]  ?flecainide (TAMBOCOR) 100 MG tablet Take 150 mg by mouth 2 (two) times daily. 12/04/15  Yes [provider]  ?lisinopril (PRINIVIL,ZESTRIL) 40 MG tablet Take 0.5 tablets (20 mg total) by mouth daily. 09/12/16  Yes Thurnell Lose, MD  ?Multiple Vitamin (MULTIVITAMIN WITH MINERALS) TABS tablet Take 1 tablet by mouth See admin instructions. Takes on Sunday,  Tuesday, Thursday, Saturday   Yes [provider]  ?Multiple Vitamins-Minerals (PRESERVISION AREDS 2 PO) Take 1 capsule by mouth See admin instructions. Takes on Sunday, Tuesday, Thursday, Saturday   Yes [provider]  ?Omega-3 Fatty Acids (FISH OIL) 1000 MG CAPS Take 1,000 mg by mouth See admin instructions. Takes on Sunday, Tuesday, Thursday, Saturday   Yes [provider]  ?rivaroxaban (XARELTO) 20 MG TABS tablet Take 1 tablet (20 mg total) by mouth daily with supper. ?Patient taking differently: Take 20 mg by mouth daily. 09/10/16  Yes Thurnell Lose, MD  ?vitamin C (ASCORBIC ACID) 250 MG tablet Take 250 mg by mouth daily.   Yes [provider]  ?   ? ?Allergies    ?Patient has no known allergies.   ? ?Review of Systems   ?Review of Systems  ?Constitutional:  Negative for fever.  ?Gastrointestinal:  Negative for abdominal pain.  ?Genitourinary:  Positive for dysuria and hematuria. Negative for flank pain.  ?Musculoskeletal:  Negative for back pain.  ? ?Physical Exam ?Updated Vital Signs ?BP (!) 139/92 (BP Location: Right Arm)   Pulse 63   Temp 98.1 ?F (36.7 ?C) (Oral)   Resp 15   Ht 6\' 3"  (1.905 m)   Wt 99.8 kg   SpO2 96%   BMI 27.50 kg/m?  ?Physical Exam ?Vitals and nursing note reviewed. Exam conducted with a chaperone present.  ?Constitutional:   ?  Appearance: He is well-developed.  ?HENT:  ?   Head: Normocephalic and atraumatic.  ?Cardiovascular:  ?   Rate and Rhythm: Normal rate and regular rhythm.  ?   Heart sounds: Normal heart sounds.  ?Pulmonary:  ?   Effort: Pulmonary effort is normal.  ?   Breath sounds: Normal breath sounds.  ?Abdominal:  ?   Palpations: Abdomen is soft.  ?   Tenderness: There is no abdominal tenderness.  ?Genitourinary: ?   Penis: No erythema, tenderness, discharge or swelling.   ?   Testes:     ?   Right: Tenderness not present.     ?   Left: Tenderness not present.  ?Skin: ?   General: Skin is warm and dry.  ?Neurological:  ?    Mental Status: He is alert.  ? ? ?ED Results / Procedures / Treatments   ?Labs ?(all labs ordered are listed, but only abnormal results are displayed) ?Labs Reviewed  ?URINALYSIS, ROUTINE W REFLEX MICROSCOPIC - Abnormal; Notable for the following components:  ?    Result Value  ? Color, Urine RED (*)   ? APPearance TURBID (*)   ? Glucose, UA   (*)   ? Value: TEST NOT REPORTED DUE TO COLOR INTERFERENCE OF URINE PIGMENT  ? Hgb urine dipstick   (*)   ? Value: TEST NOT REPORTED DUE TO COLOR INTERFERENCE OF URINE PIGMENT  ? Bilirubin Urine   (*)   ? Value: TEST NOT REPORTED DUE TO COLOR INTERFERENCE OF URINE PIGMENT  ? Ketones, ur   (*)   ? Value: TEST NOT REPORTED DUE TO COLOR INTERFERENCE OF URINE PIGMENT  ? Protein, ur   (*)   ? Value: TEST NOT REPORTED DUE TO COLOR INTERFERENCE OF URINE PIGMENT  ? Nitrite   (*)   ? Value: TEST NOT REPORTED DUE TO COLOR INTERFERENCE OF URINE PIGMENT  ? Leukocytes,Ua   (*)   ? Value: TEST NOT REPORTED DUE TO COLOR INTERFERENCE OF URINE PIGMENT  ? All other components within normal limits  ?BASIC METABOLIC PANEL - Abnormal; Notable for the following components:  ? Glucose, Bld 123 (*)   ? All other components within normal limits  ?URINALYSIS, MICROSCOPIC (REFLEX) - Abnormal; Notable for the following components:  ? Bacteria, UA RARE (*)   ? All other components within normal limits  ?CBC  ? ? ?EKG ?None ? ?Radiology ?CT Renal Stone Study ? ?Result Date: 03/10/2022 ?CLINICAL DATA:  76 year old male with history of hematuria. EXAM: CT ABDOMEN AND PELVIS WITHOUT CONTRAST TECHNIQUE: Multidetector CT imaging of the abdomen and pelvis was performed following the standard protocol without IV contrast. RADIATION DOSE REDUCTION: This exam was performed according to the departmental dose-optimization program which includes automated exposure control, adjustment of the mA and/or kV according to patient size and/or use of iterative reconstruction technique. COMPARISON:  CT the abdomen and pelvis  03/08/2012. FINDINGS: Lower chest: Atherosclerotic calcifications in the left main, left anterior descending, left circumflex and right coronary arteries. Hepatobiliary: No definite suspicious cystic or solid hepatic lesions are confidently identified on today's noncontrast CT examination. Gallbladder is unremarkable in appearance. Pancreas: No definite pancreatic mass or peripancreatic fluid collections or inflammatory changes are noted on today's noncontrast CT examination. Spleen: Unremarkable. Adrenals/Urinary Tract: Exophytic 4.5 cm low-attenuation lesion in the lateral aspect of the interpolar region of the right kidney, incompletely characterized on today's non-contrast CT examination, but similar to the prior study, previously characterized as a simple cyst. Unenhanced appearance of  the left kidney and bilateral adrenal glands is normal. No calcifications are identified within the collecting system of either kidney, along the course of either ureter, or within the lumen of the urinary bladder. No hydroureteronephrosis. The anterior aspect of the urinary bladder appears thickened, particularly on the right side (axial image 87 of series 3), poorly evaluated on today's noncontrast CT examination. Stomach/Bowel: The appearance of the stomach is normal. There is no pathologic dilatation of small bowel or colon. A few scattered colonic diverticulae are noted, without surrounding inflammatory changes to suggest an acute diverticulitis at this time. Normal appendix. Vascular/Lymphatic: Aortic atherosclerosis, without evidence of aneurysm or dissection in the abdominal or pelvic vasculature. No lymphadenopathy noted in the abdomen or pelvis. Reproductive: Prostate gland and seminal vesicles are unremarkable in appearance. Other: Bilateral inguinal hernias (right greater than left) containing only fat. No significant volume of ascites. No pneumoperitoneum. Musculoskeletal: There are no aggressive appearing lytic or  blastic lesions noted in the visualized portions of the skeleton. IMPRESSION: 1. No urinary tract calculi or findings of urinary tract obstruction. 2. There is some asymmetric thickening of the anterior wall of the ur

## 2022-03-10 NOTE — Discharge Instructions (Addendum)
If you develop flank or abdominal pain, pain with urination, inability to urinate, fever, or any other new/concerning symptoms then return to the ER for evaluation. ?

## 2022-03-10 NOTE — ED Notes (Signed)
Reviewed discharge instructions with patient and spouse. Follow-up care reviewed. Patient and spouse verbalized understanding. Patient A&Ox4, VSS, and ambulatory with steady gait upon discharge. 

## 2022-03-10 NOTE — ED Notes (Signed)
Pt reports relief after providing urine specimen here and doesn't feel like he has a blockage. ?
